# Patient Record
Sex: Female | Born: 1960 | Hispanic: Yes | Marital: Married | Smoking: Never smoker
Health system: Southern US, Community
[De-identification: ages and names within clinical notes are randomized; demographics above are authoritative.]

## PROBLEM LIST (undated history)

## (undated) DIAGNOSIS — E785 Hyperlipidemia, unspecified: Secondary | ICD-10-CM

## (undated) DIAGNOSIS — N183 Chronic kidney disease, stage 3 unspecified: Secondary | ICD-10-CM

## (undated) DIAGNOSIS — N281 Cyst of kidney, acquired: Secondary | ICD-10-CM

## (undated) DIAGNOSIS — M419 Scoliosis, unspecified: Secondary | ICD-10-CM

## (undated) DIAGNOSIS — R519 Headache, unspecified: Secondary | ICD-10-CM

## (undated) DIAGNOSIS — R51 Headache: Secondary | ICD-10-CM

## (undated) HISTORY — PX: APPENDECTOMY: SHX54

## (undated) HISTORY — PX: OTHER SURGICAL HISTORY: SHX169

## (undated) HISTORY — PX: TONSILLECTOMY: SUR1361

## (undated) HISTORY — PX: PTERYGIUM EXCISION: SHX2273

---

## 2011-11-27 HISTORY — PX: AUGMENTATION MAMMAPLASTY: SUR837

## 2015-04-12 ENCOUNTER — Other Ambulatory Visit: Payer: Self-pay | Admitting: Family Medicine

## 2015-04-12 DIAGNOSIS — Z1231 Encounter for screening mammogram for malignant neoplasm of breast: Secondary | ICD-10-CM

## 2015-04-20 ENCOUNTER — Ambulatory Visit: Payer: Self-pay | Attending: Family Medicine

## 2016-04-13 ENCOUNTER — Other Ambulatory Visit: Payer: Self-pay | Admitting: Family Medicine

## 2016-04-13 DIAGNOSIS — Z1231 Encounter for screening mammogram for malignant neoplasm of breast: Secondary | ICD-10-CM

## 2016-04-16 ENCOUNTER — Ambulatory Visit
Admission: RE | Admit: 2016-04-16 | Discharge: 2016-04-16 | Disposition: A | Discharge status: Home / Self Care | Payer: Federal, State, Local not specified - PPO | Source: Ambulatory Visit | Attending: Family Medicine | Admitting: Family Medicine

## 2016-04-16 ENCOUNTER — Other Ambulatory Visit: Payer: Self-pay | Admitting: Family Medicine

## 2016-04-16 DIAGNOSIS — Z1231 Encounter for screening mammogram for malignant neoplasm of breast: Secondary | ICD-10-CM | POA: Insufficient documentation

## 2016-07-23 ENCOUNTER — Encounter: Payer: Self-pay | Admitting: *Deleted

## 2016-07-24 ENCOUNTER — Encounter: Admission: RE | Payer: Self-pay | Source: Ambulatory Visit

## 2016-07-24 ENCOUNTER — Ambulatory Visit
Admission: RE | Admit: 2016-07-24 | Payer: Federal, State, Local not specified - PPO | Source: Ambulatory Visit | Admitting: Gastroenterology

## 2016-07-24 HISTORY — DX: Headache: R51

## 2016-07-24 HISTORY — DX: Headache, unspecified: R51.9

## 2016-07-24 SURGERY — COLONOSCOPY WITH PROPOFOL
Anesthesia: General

## 2016-11-02 ENCOUNTER — Encounter: Payer: Self-pay | Admitting: *Deleted

## 2016-11-05 ENCOUNTER — Encounter: Admission: RE | Payer: Self-pay | Source: Ambulatory Visit

## 2016-11-05 ENCOUNTER — Ambulatory Visit
Admission: RE | Admit: 2016-11-05 | Payer: Federal, State, Local not specified - PPO | Source: Ambulatory Visit | Admitting: Gastroenterology

## 2016-11-05 HISTORY — DX: Scoliosis, unspecified: M41.9

## 2016-11-05 SURGERY — COLONOSCOPY WITH PROPOFOL
Anesthesia: General

## 2017-05-20 ENCOUNTER — Other Ambulatory Visit: Payer: Self-pay | Admitting: Family Medicine

## 2017-05-20 DIAGNOSIS — Z1231 Encounter for screening mammogram for malignant neoplasm of breast: Secondary | ICD-10-CM

## 2017-05-23 ENCOUNTER — Ambulatory Visit
Admission: RE | Admit: 2017-05-23 | Discharge: 2017-05-23 | Disposition: A | Discharge status: Home / Self Care | Payer: Federal, State, Local not specified - PPO | Source: Ambulatory Visit | Attending: Family Medicine | Admitting: Family Medicine

## 2017-05-23 DIAGNOSIS — Z1231 Encounter for screening mammogram for malignant neoplasm of breast: Secondary | ICD-10-CM | POA: Insufficient documentation

## 2018-04-16 ENCOUNTER — Other Ambulatory Visit: Payer: Self-pay | Admitting: Family Medicine

## 2018-04-16 DIAGNOSIS — Z1231 Encounter for screening mammogram for malignant neoplasm of breast: Secondary | ICD-10-CM

## 2018-05-27 ENCOUNTER — Ambulatory Visit
Admission: RE | Admit: 2018-05-27 | Discharge: 2018-05-27 | Disposition: A | Discharge status: Home / Self Care | Payer: Federal, State, Local not specified - PPO | Source: Ambulatory Visit | Attending: Family Medicine | Admitting: Family Medicine

## 2018-05-27 ENCOUNTER — Other Ambulatory Visit: Payer: Self-pay | Admitting: Family Medicine

## 2018-05-27 DIAGNOSIS — Z1231 Encounter for screening mammogram for malignant neoplasm of breast: Secondary | ICD-10-CM

## 2018-06-12 IMAGING — MG MM DIGITAL SCREENING IMPLANTS W/ CAD
8 series · 8 of 8 positions shown · non-contrast
Comparison: Previous exam(s).

CLINICAL DATA: Screening.

EXAM:
DIGITAL SCREENING BILATERAL MAMMOGRAM WITH IMPLANTS AND CAD
The patient has retropectoral implants. Standard and implant
displaced views were performed.

[L MLO (1 of 2)]
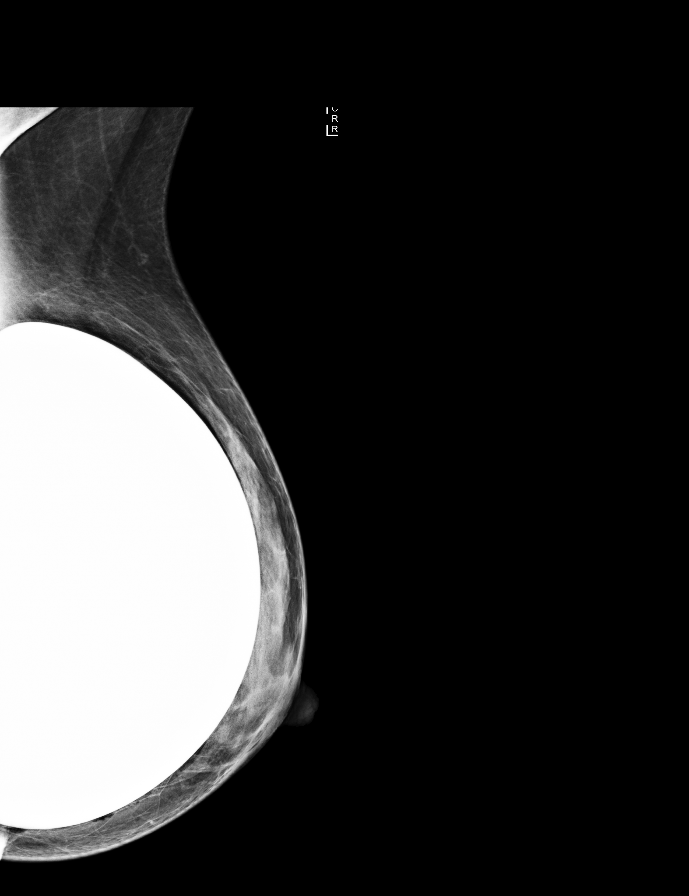

[R CC (1 of 2)]
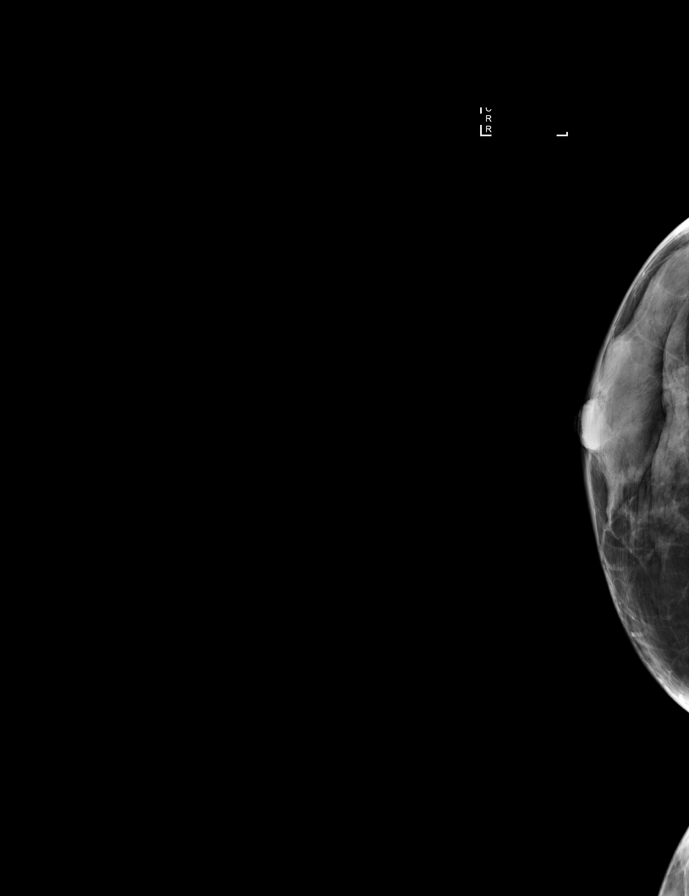

[L CC (1 of 2)]
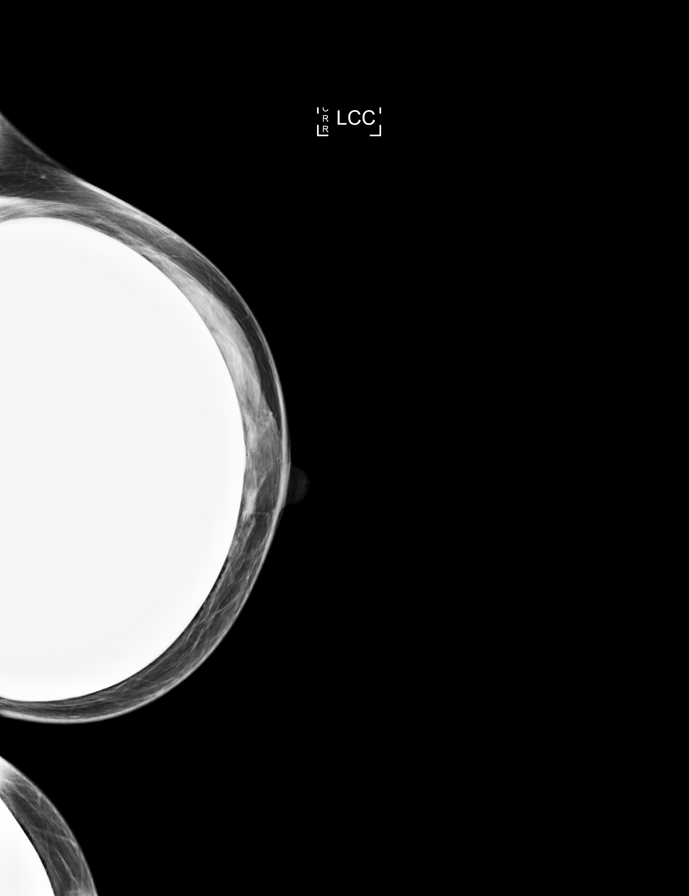

[L CC (2 of 2)]
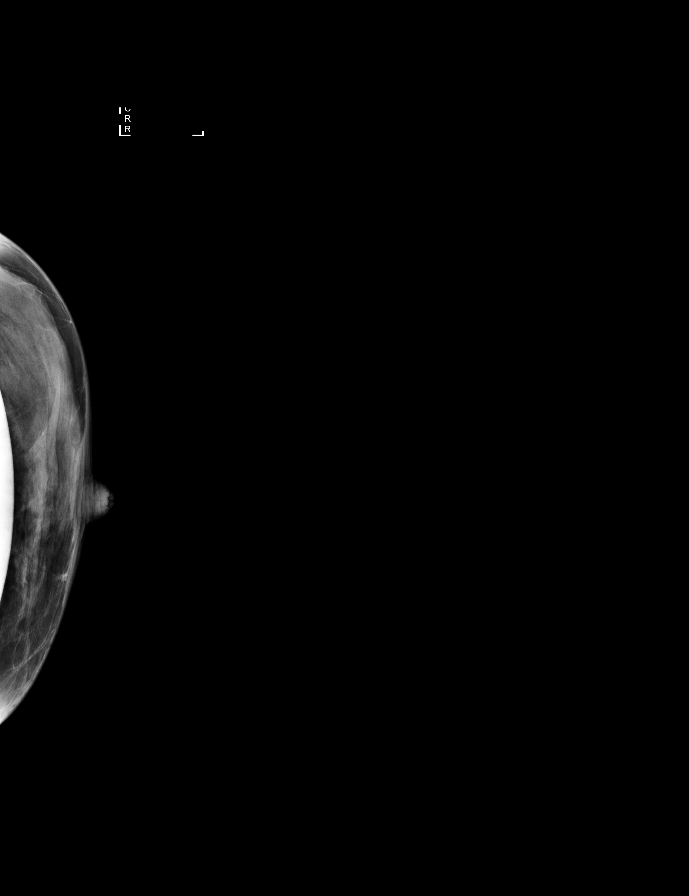

[R CC (2 of 2)]
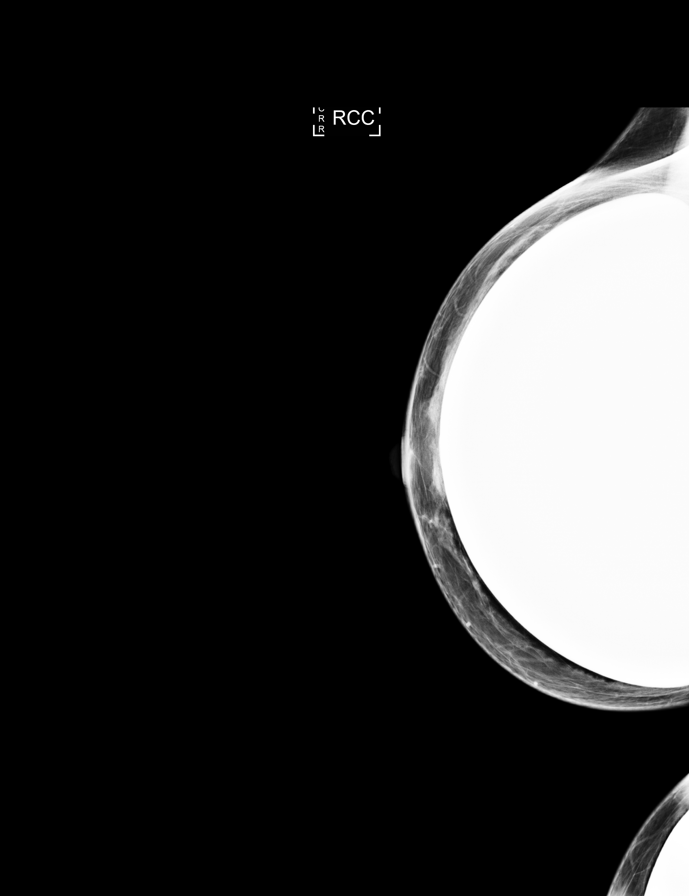

[L MLO (2 of 2)]
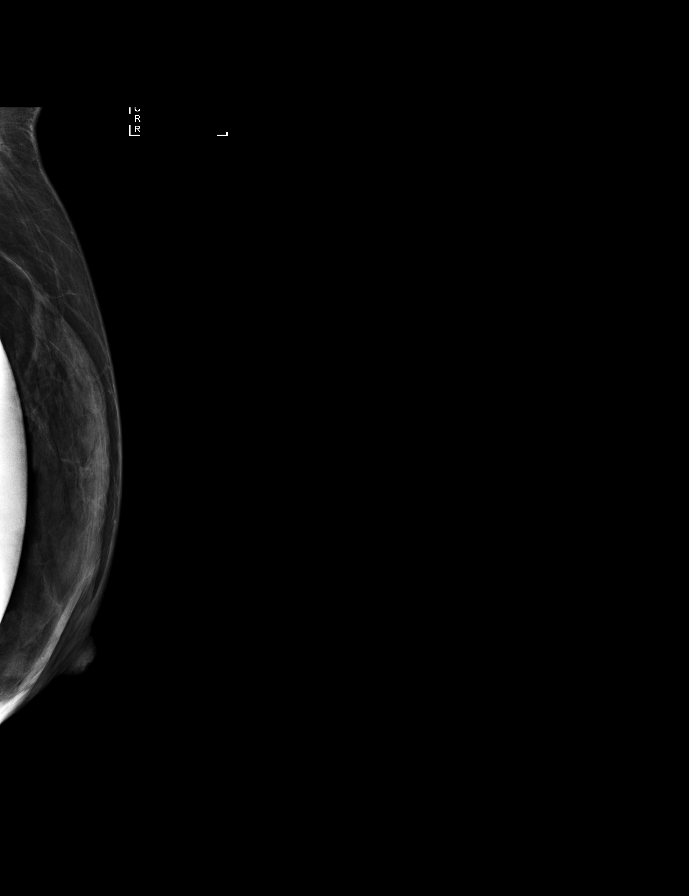

[R MLO (1 of 2)]
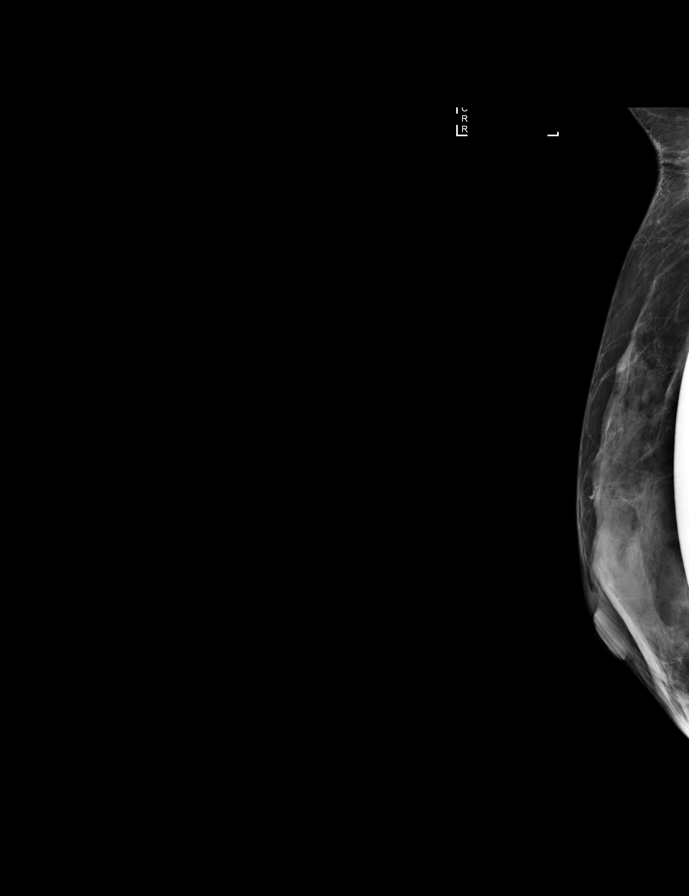

[R MLO (2 of 2)]
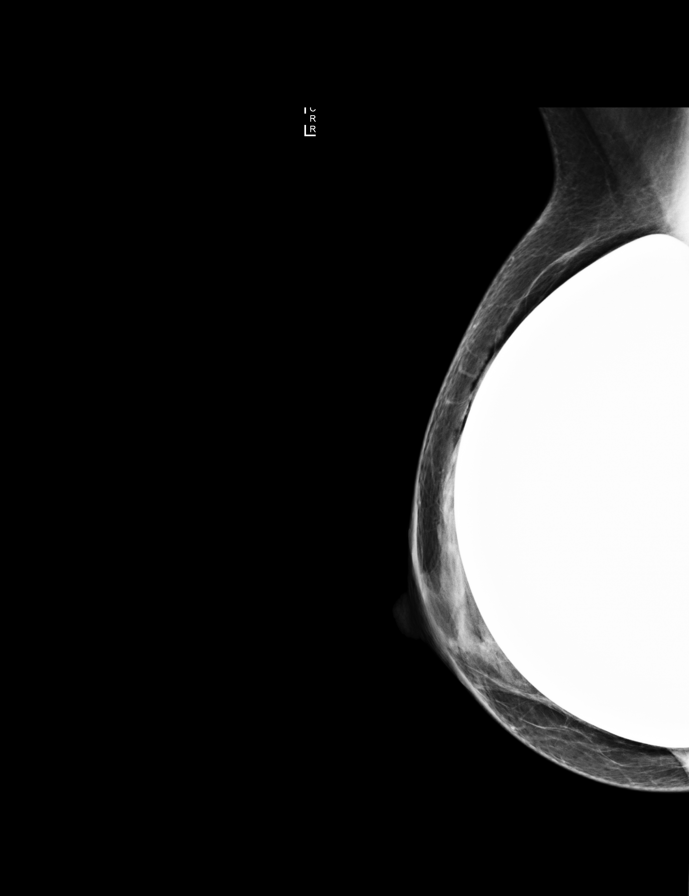

[8 of 8 positions shown; findings below may reference images not displayed]

ACR Breast Density Category d: The breast tissue is extremely dense,
which lowers the sensitivity of mammography.
FINDINGS: There are no findings suspicious for malignancy. Images were
processed with CAD.
IMPRESSION: No mammographic evidence of malignancy. A result letter of this
screening mammogram will be mailed directly to the patient.

RECOMMENDATION:
Screening mammogram in one year. (Code:[id number]-[id number])

BI-RADS CATEGORY  1:  Negative.

## 2020-05-04 ENCOUNTER — Ambulatory Visit
Admission: EM | Admit: 2020-05-04 | Discharge: 2020-05-04 | Disposition: A | Discharge status: Home / Self Care | Payer: Federal, State, Local not specified - PPO | Attending: Emergency Medicine | Admitting: Emergency Medicine

## 2020-05-04 ENCOUNTER — Encounter: Payer: Self-pay | Admitting: Emergency Medicine

## 2020-05-04 ENCOUNTER — Other Ambulatory Visit: Payer: Self-pay

## 2020-05-04 DIAGNOSIS — N183 Chronic kidney disease, stage 3 unspecified: Secondary | ICD-10-CM | POA: Insufficient documentation

## 2020-05-04 DIAGNOSIS — N1832 Chronic kidney disease, stage 3b: Secondary | ICD-10-CM

## 2020-05-04 DIAGNOSIS — M419 Scoliosis, unspecified: Secondary | ICD-10-CM | POA: Insufficient documentation

## 2020-05-04 DIAGNOSIS — E785 Hyperlipidemia, unspecified: Secondary | ICD-10-CM | POA: Insufficient documentation

## 2020-05-04 DIAGNOSIS — Z20822 Contact with and (suspected) exposure to covid-19: Secondary | ICD-10-CM | POA: Diagnosis not present

## 2020-05-04 DIAGNOSIS — Z79899 Other long term (current) drug therapy: Secondary | ICD-10-CM | POA: Diagnosis not present

## 2020-05-04 DIAGNOSIS — R197 Diarrhea, unspecified: Secondary | ICD-10-CM | POA: Insufficient documentation

## 2020-05-04 DIAGNOSIS — R519 Headache, unspecified: Secondary | ICD-10-CM

## 2020-05-04 DIAGNOSIS — H53149 Visual discomfort, unspecified: Secondary | ICD-10-CM | POA: Diagnosis not present

## 2020-05-04 HISTORY — DX: Cyst of kidney, acquired: N28.1

## 2020-05-04 HISTORY — DX: Hyperlipidemia, unspecified: E78.5

## 2020-05-04 HISTORY — DX: Chronic kidney disease, stage 3 unspecified: N18.30

## 2020-05-04 LAB — BASIC METABOLIC PANEL
Anion gap: 10 (ref 5–15)
BUN: 24 mg/dL — ABNORMAL HIGH (ref 6–20)
CO2: 24 mmol/L (ref 22–32)
Calcium: 9.2 mg/dL (ref 8.9–10.3)
Chloride: 105 mmol/L (ref 98–111)
Creatinine, Ser: 1.58 mg/dL — ABNORMAL HIGH (ref 0.44–1.00)
GFR calc Af Amer: 42 mL/min — ABNORMAL LOW (ref >60)
GFR calc non Af Amer: 36 mL/min — ABNORMAL LOW (ref >60)
Glucose, Bld: 117 mg/dL — ABNORMAL HIGH (ref 70–99)
Potassium: 5 mmol/L (ref 3.5–5.1)
Sodium: 139 mmol/L (ref 135–145)

## 2020-05-04 MED ORDER — DEXAMETHASONE SODIUM PHOSPHATE 10 MG/ML IJ SOLN
10.0000 mg | Freq: Once | INTRAMUSCULAR | Status: AC
  Administered 2020-05-04: 10 mg via INTRAMUSCULAR

## 2020-05-04 MED ORDER — ACETAMINOPHEN 500 MG PO TABS
1000.0000 mg | ORAL_TABLET | Freq: Once | ORAL | Status: AC
  Administered 2020-05-04: 1000 mg via ORAL

## 2020-05-04 MED ORDER — ONDANSETRON 4 MG PO TBDP
4.0000 mg | ORAL_TABLET | Freq: Once | ORAL | Status: AC
Start: 2020-05-04 — End: 2020-05-04
  Administered 2020-05-04: 4 mg via ORAL

## 2020-05-04 MED ORDER — ONDANSETRON 4 MG PO TBDP: 4.0000 mg | ORAL_TABLET | Freq: Three times a day (TID) | ORAL | 0 refills | Status: AC | PRN

## 2020-05-04 NOTE — ED Triage Notes (Signed)
Patient c/o dizziness, diarrhea, headache that started last night.

## 2020-05-04 NOTE — Discharge Instructions (Addendum)
May take at 1000 mg of Tylenol every 6-8 hours as needed for headache.  Do not use Excedrin.  Zofran for nausea.  It may cause constipation.  We will contact you if your Covid test comes back positive.  Follow-up with your kidney doctor.  Your creatinine today was 1.58. Follow-up with Dr. Zada Finders in 2 or 3 days if you are not getting any better.

## 2020-05-04 NOTE — ED Provider Notes (Signed)
HPI  SUBJECTIVE:  Robin Schultz is a 59 y.o. female who reports an acute onset right temporal headache described as constant, throbbing starting last night.  States it was worst at the onset but seems to be getting better. she reports nausea, photophobia.  She reports generalized weakness.  No vomiting, fevers, photophobia, ear pain, jaw pain, dental pain, nasal congestion, purulent nasal discharge, sinus pain/ pressure, rhinorrhea.  No body aches, neck stiffness, rash.  No slurred speech, arm or leg weakness, facial droop, discoordination.  No blurry vision, double vision, visual loss.  No mental status changes, seizures, syncope.  No loss of smell or taste, cough, shortness of breath.  She had 4 episodes of diarrhea today.  No abdominal pain.  No known Covid exposure.  She has gotten the 1 dose of the J&J vaccine.  She tried Excedrin without improvement in her symptoms.  There are no aggravating factors.  It is not associated with movement.  States that she had similar headaches in this area after getting the Covid vaccine.  She has a past medical history of migraines, chronic kidney disease.  Does not know what her baseline creatinine is.  No history of hypertension, diabetes, stroke, aneurysm, ICH/SAH, frequent sinusitis, temporal arteritis, atrial fibrillation.  She is not on any anticoagulants or platelets.  PMD: Dr. Zada Finders.  All history obtained through a friend acting as interpreter.  Past Medical History:  Diagnosis Date  . Chronic kidney disease (CKD), stage III (moderate)   . Headache   . Hyperlipidemia   . Renal cyst   . Scoliosis     Past Surgical History:  Procedure Laterality Date  . APPENDECTOMY    . AUGMENTATION MAMMAPLASTY Bilateral 2013  . PTERYGIUM EXCISION    . TONSILLECTOMY    . Transumblical Augmentation mammaplasty      Family History  Problem Relation Age of Onset  . Breast cancer Sister 46    Social History   Tobacco Use  . Smoking status: Never Smoker  .  Smokeless tobacco: Never Used  Substance Use Topics  . Alcohol use: No  . Drug use: No    No current facility-administered medications for this encounter.  Current Outpatient Medications:  .  atorvastatin (LIPITOR) 20 MG tablet, Take by mouth., Disp: , Rfl:  .  losartan (COZAAR) 25 MG tablet, Take by mouth., Disp: , Rfl:  .  Multiple Vitamin (MULTIVITAMIN) tablet, Take 1 tablet by mouth daily., Disp: , Rfl:  .  ondansetron (ZOFRAN ODT) 4 MG disintegrating tablet, Take 1 tablet (4 mg total) by mouth every 8 (eight) hours as needed for nausea or vomiting., Disp: 20 tablet, Rfl: 0  No Known Allergies   ROS  As noted in HPI.   Physical Exam  BP 123/81 (BP Location: Right Arm)   Pulse 83   Temp 98.5 F (36.9 C) (Oral)   Resp 18   SpO2 100%   Constitutional: Well developed, well nourished, appears uncomfortable. Eyes: PERRL, EOMI, conjunctiva normal bilaterally.  Positive photophobia HENT: Normocephalic, atraumatic,mucus membranes moist, normal dentition.  TM normal b/l. No TMJ tenderness.  No nasal congestion, no sinus tenderness. No temporal artery tenderness.  Neck: no cervical LN + bilateral trapezial muscle tenderness. No meningismus Respiratory: normal inspiratory effort, lungs clear bilaterally Cardiovascular: Normal rate, regular rhythm no murmurs rubs or gallops GI:  nondistended skin: No rash, skin intact Musculoskeletal: No edema, no tenderness, no deformities Neurologic: Alert & oriented x 3, CN III-XII intact, romberg neg, finger-> nose, heel-> shin  equal b/l, Romberg neg, tandem gait steady.  Speech fluent. Psychiatric: Speech and behavior appropriate   ED Course   Medications  acetaminophen (TYLENOL) tablet 1,000 mg (1,000 mg Oral Given 05/04/20 1607)  dexamethasone (DECADRON) injection 10 mg (10 mg Intramuscular Given 05/04/20 1609)  ondansetron (ZOFRAN-ODT) disintegrating tablet 4 mg (4 mg Oral Given 05/04/20 1607)    Orders Placed This Encounter  Procedures   . SARS CORONAVIRUS 2 (TAT 6-24 HRS) Nasopharyngeal Nasopharyngeal Swab    Standing Status:   Standing    Number of Occurrences:   1    Order Specific Question:   Is this test for diagnosis or screening    Answer:   Diagnosis of ill patient    Order Specific Question:   Symptomatic for COVID-19 as defined by CDC    Answer:   Yes    Order Specific Question:   Date of Symptom Onset    Answer:   05/03/2020    Order Specific Question:   Hospitalized for COVID-19    Answer:   Yes    Order Specific Question:   Admitted to ICU for COVID-19    Answer:   No    Order Specific Question:   Previously tested for COVID-19    Answer:   No    Order Specific Question:   Resident in a congregate (group) care setting    Answer:   No    Order Specific Question:   Employed in healthcare setting    Answer:   No    Order Specific Question:   Pregnant    Answer:   No    Order Specific Question:   Has patient completed COVID vaccination(s) (2 doses of Pfizer/Moderna 1 dose of [employer])    Answer:   Yes  . Basic metabolic panel    Standing Status:   Standing    Number of Occurrences:   1   Results for orders placed or performed during the hospital encounter of 05/04/20 (from the past 24 hour(s))  Basic metabolic panel     Status: Abnormal   Collection Time: 05/04/20  3:14 PM  Result Value Ref Range   Sodium 139 135 - 145 mmol/L   Potassium 5.0 3.5 - 5.1 mmol/L   Chloride 105 98 - 111 mmol/L   CO2 24 22 - 32 mmol/L   Glucose, Bld 117 (H) 70 - 99 mg/dL   BUN 24 (H) 6 - 20 mg/dL   Creatinine, Ser 6.26 (H) 0.44 - 1.00 mg/dL   Calcium 9.2 8.9 - 94.8 mg/dL   GFR calc non Af Amer 36 (L) >60 mL/min   GFR calc Af Amer 42 (L) >60 mL/min   Anion gap 10 5 - 15   No results found.   ED Clinical Impression  1. Bad headache   2. Stage 3b chronic kidney disease     ED Assessment/Plan  She has several concerning historical features of her headache: She states that it was a sudden onset headache,  worst at the beginning. However she also states that she had identical headaches after getting the J&J vaccine. She does not have any focal neurologic deficits and the headache started last night.  We do not have CT at this facility today.  Discussed with patient that I could transfer her to the emergency department for further evaluation and imaging or we could try treating this aggressively as a migraine headache after getting kidney function with the understanding that if the headache changes  or gets worse she is to go immediately to the emergency department.  Patient has opted to try treating this as if this is a migraine.  Pt without fevers/chills, Pt has no meningeal sx, no nuchal rigidity. Doubt meningitis. Pt with normal neuro exam, no evidence of CVA/TIA.  Pt BP not elevated significantly, doubt hypertensive emergency. No evidence of temporal artery tenderness, no evidence of glaucoma or other ocular pathology.  Care everywhere records and labs reviewed.  Last creatinine 10/20/2019 was 1.3. Today's BUN 24 creatinine 1.58.  Calculated creatinine clearance 34.96 mL/min.  will give 1000 g of Tylenol 4 mg of Zofran and 10 mg of dexamethasone IM.  No NSAIDs due to creatinine clearance.   Pt much improved after medications. Pt with continued non-focal neuro exam.   Patient with a bad headache and worsening of her kidney function.  Will d/c home with Tylenol 1000 mg every 6-8 hours, Zofran 4 mg 3 times daily  and have pt F/U with PCP if not getting better.  Discussed with her to to limit the use of Excedrin.  She has her husband at home who can keep an eye on her.  Gave her strict ER return precautions.  Using a translator, discussed labs, MDM, plan for follow up, signs and sx that should prompt return to ER.  Also discussed findings, labs, treatment plan and plan for follow-up with husband.  They agree with plan  Meds ordered this encounter  Medications  . acetaminophen (TYLENOL) tablet 1,000 mg  .  dexamethasone (DECADRON) injection 10 mg  . ondansetron (ZOFRAN-ODT) disintegrating tablet 4 mg  . ondansetron (ZOFRAN ODT) 4 MG disintegrating tablet    Sig: Take 1 tablet (4 mg total) by mouth every 8 (eight) hours as needed for nausea or vomiting.    Dispense:  20 tablet    Refill:  0    *This clinic note was created using [employer]. Therefore, there may be occasional mistakes despite careful proofreading.  ?    Melynda Ripple, MD 05/04/20 [phone]

## 2020-05-05 ENCOUNTER — Telehealth: Payer: Self-pay | Admitting: Emergency Medicine

## 2020-05-05 LAB — SARS CORONAVIRUS 2 (TAT 6-24 HRS): SARS Coronavirus 2: NEGATIVE

## 2020-05-05 NOTE — Telephone Encounter (Signed)
Telephone note created in error AM

## 2020-09-12 ENCOUNTER — Other Ambulatory Visit: Payer: Self-pay | Admitting: Family Medicine

## 2020-09-12 DIAGNOSIS — Z1231 Encounter for screening mammogram for malignant neoplasm of breast: Secondary | ICD-10-CM

## 2020-09-28 ENCOUNTER — Other Ambulatory Visit: Payer: Self-pay

## 2020-09-28 ENCOUNTER — Ambulatory Visit
Admission: RE | Admit: 2020-09-28 | Discharge: 2020-09-28 | Disposition: A | Discharge status: Home / Self Care | Payer: Federal, State, Local not specified - PPO | Source: Ambulatory Visit | Attending: Family Medicine | Admitting: Family Medicine

## 2020-09-28 DIAGNOSIS — Z1231 Encounter for screening mammogram for malignant neoplasm of breast: Secondary | ICD-10-CM | POA: Diagnosis not present

## 2021-04-27 ENCOUNTER — Encounter: Payer: Self-pay | Admitting: *Deleted

## 2021-04-28 ENCOUNTER — Encounter: Admission: RE | Payer: Self-pay | Source: Home / Self Care

## 2021-04-28 ENCOUNTER — Ambulatory Visit: Admission: RE | Admit: 2021-04-28 | Payer: Federal, State, Local not specified - PPO | Source: Home / Self Care

## 2021-04-28 SURGERY — COLONOSCOPY WITH PROPOFOL
Anesthesia: General

## 2022-03-15 ENCOUNTER — Other Ambulatory Visit: Payer: Self-pay | Admitting: Family Medicine

## 2022-03-15 DIAGNOSIS — Z1231 Encounter for screening mammogram for malignant neoplasm of breast: Secondary | ICD-10-CM

## 2022-04-25 ENCOUNTER — Ambulatory Visit
Admission: RE | Admit: 2022-04-25 | Discharge: 2022-04-25 | Disposition: A | Discharge status: Home / Self Care | Payer: Federal, State, Local not specified - PPO | Source: Ambulatory Visit | Attending: Family Medicine | Admitting: Family Medicine

## 2022-04-25 DIAGNOSIS — Z1231 Encounter for screening mammogram for malignant neoplasm of breast: Secondary | ICD-10-CM | POA: Insufficient documentation

## 2023-02-26 ENCOUNTER — Other Ambulatory Visit: Payer: Self-pay

## 2023-02-26 ENCOUNTER — Ambulatory Visit: Admission: EM | Admit: 2023-02-26 | Discharge: 2023-02-26

## 2023-02-26 ENCOUNTER — Emergency Department
Admission: EM | Admit: 2023-02-26 | Discharge: 2023-02-27 | Disposition: A | Discharge status: Home / Self Care | Attending: Emergency Medicine | Admitting: Emergency Medicine

## 2023-02-26 ENCOUNTER — Encounter: Payer: Self-pay | Admitting: Emergency Medicine

## 2023-02-26 ENCOUNTER — Emergency Department

## 2023-02-26 DIAGNOSIS — R443 Hallucinations, unspecified: Secondary | ICD-10-CM

## 2023-02-26 DIAGNOSIS — F419 Anxiety disorder, unspecified: Secondary | ICD-10-CM | POA: Diagnosis not present

## 2023-02-26 DIAGNOSIS — R519 Headache, unspecified: Secondary | ICD-10-CM

## 2023-02-26 DIAGNOSIS — N183 Chronic kidney disease, stage 3 unspecified: Secondary | ICD-10-CM | POA: Diagnosis not present

## 2023-02-26 DIAGNOSIS — F333 Major depressive disorder, recurrent, severe with psychotic symptoms: Secondary | ICD-10-CM | POA: Diagnosis not present

## 2023-02-26 DIAGNOSIS — F32A Depression, unspecified: Secondary | ICD-10-CM

## 2023-02-26 DIAGNOSIS — R440 Auditory hallucinations: Secondary | ICD-10-CM | POA: Diagnosis present

## 2023-02-26 DIAGNOSIS — F29 Unspecified psychosis not due to a substance or known physiological condition: Secondary | ICD-10-CM

## 2023-02-26 LAB — CBC WITH DIFFERENTIAL/PLATELET
Abs Immature Granulocytes: 0.02 10*3/uL (ref 0.00–0.07)
Basophils Absolute: 0 10*3/uL (ref 0.0–0.1)
Basophils Relative: 1 %
Eosinophils Absolute: 0 10*3/uL (ref 0.0–0.5)
Eosinophils Relative: 1 %
HCT: 33.9 % — ABNORMAL LOW (ref 36.0–46.0)
Hemoglobin: 10.7 g/dL — ABNORMAL LOW (ref 12.0–15.0)
Immature Granulocytes: 0 %
Lymphocytes Relative: 28 %
Lymphs Abs: 1.8 10*3/uL (ref 0.7–4.0)
MCH: 30 pg (ref 26.0–34.0)
MCHC: 31.6 g/dL (ref 30.0–36.0)
MCV: 95 fL (ref 80.0–100.0)
Monocytes Absolute: 0.5 10*3/uL (ref 0.1–1.0)
Monocytes Relative: 7 %
Neutro Abs: 4.1 10*3/uL (ref 1.7–7.7)
Neutrophils Relative %: 63 %
Platelets: 246 10*3/uL (ref 150–400)
RBC: 3.57 MIL/uL — ABNORMAL LOW (ref 3.87–5.11)
RDW: 14.1 % (ref 11.5–15.5)
WBC: 6.5 10*3/uL (ref 4.0–10.5)
nRBC: 0 % (ref 0.0–0.2)

## 2023-02-26 LAB — URINALYSIS, ROUTINE W REFLEX MICROSCOPIC
Bilirubin Urine: NEGATIVE
Glucose, UA: NEGATIVE mg/dL
Ketones, ur: NEGATIVE mg/dL
Leukocytes,Ua: NEGATIVE
Nitrite: NEGATIVE
Protein, ur: 100 mg/dL — AB
Specific Gravity, Urine: 1.01 (ref 1.005–1.030)
pH: 6 (ref 5.0–8.0)

## 2023-02-26 LAB — COMPREHENSIVE METABOLIC PANEL
ALT: 15 U/L (ref 0–44)
AST: 29 U/L (ref 15–41)
Albumin: 4.3 g/dL (ref 3.5–5.0)
Alkaline Phosphatase: 81 U/L (ref 38–126)
Anion gap: 11 (ref 5–15)
BUN: 23 mg/dL — ABNORMAL HIGH (ref 6–20)
CO2: 23 mmol/L (ref 22–32)
Calcium: 9.1 mg/dL (ref 8.9–10.3)
Chloride: 105 mmol/L (ref 98–111)
Creatinine, Ser: 1.54 mg/dL — ABNORMAL HIGH (ref 0.44–1.00)
GFR, Estimated: 39 mL/min — ABNORMAL LOW (ref >60)
Glucose, Bld: 113 mg/dL — ABNORMAL HIGH (ref 70–99)
Potassium: 4 mmol/L (ref 3.5–5.1)
Sodium: 139 mmol/L (ref 135–145)
Total Bilirubin: 0.9 mg/dL (ref 0.3–1.2)
Total Protein: 7.3 g/dL (ref 6.5–8.1)

## 2023-02-26 LAB — SALICYLATE LEVEL: Salicylate Lvl: <7 mg/dL — ABNORMAL LOW (ref 7.0–30.0)

## 2023-02-26 LAB — URINE DRUG SCREEN, QUALITATIVE (ARMC ONLY)
Amphetamines, Ur Screen: NOT DETECTED
Barbiturates, Ur Screen: NOT DETECTED
Benzodiazepine, Ur Scrn: NOT DETECTED
Cannabinoid 50 Ng, Ur ~~LOC~~: NOT DETECTED
Cocaine Metabolite,Ur ~~LOC~~: NOT DETECTED
MDMA (Ecstasy)Ur Screen: NOT DETECTED
Methadone Scn, Ur: NOT DETECTED
Opiate, Ur Screen: NOT DETECTED
Phencyclidine (PCP) Ur S: NOT DETECTED
Tricyclic, Ur Screen: NOT DETECTED

## 2023-02-26 LAB — POC URINE PREG, ED: Preg Test, Ur: NEGATIVE

## 2023-02-26 LAB — CBC
HCT: 34.1 % — ABNORMAL LOW (ref 36.0–46.0)
Hemoglobin: 10.6 g/dL — ABNORMAL LOW (ref 12.0–15.0)
MCH: 29.2 pg (ref 26.0–34.0)
MCHC: 31.1 g/dL (ref 30.0–36.0)
MCV: 93.9 fL (ref 80.0–100.0)
Platelets: 248 10*3/uL (ref 150–400)
RBC: 3.63 MIL/uL — ABNORMAL LOW (ref 3.87–5.11)
RDW: 14.1 % (ref 11.5–15.5)
WBC: 6.4 10*3/uL (ref 4.0–10.5)
nRBC: 0 % (ref 0.0–0.2)

## 2023-02-26 LAB — ETHANOL: Alcohol, Ethyl (B): <10 mg/dL (ref <10)

## 2023-02-26 LAB — ACETAMINOPHEN LEVEL: Acetaminophen (Tylenol), Serum: <10 ug/mL — ABNORMAL LOW (ref 10–30)

## 2023-02-26 LAB — T4, FREE: Free T4: 0.88 ng/dL (ref 0.61–1.12)

## 2023-02-26 LAB — TSH: TSH: 2.338 u[IU]/mL (ref 0.350–4.500)

## 2023-02-26 MED ORDER — DIPHENHYDRAMINE HCL 25 MG PO CAPS: 50.0000 mg | ORAL_CAPSULE | Freq: Three times a day (TID) | ORAL | Status: DC | PRN

## 2023-02-26 MED ORDER — ACETAMINOPHEN 325 MG PO TABS
650.0000 mg | ORAL_TABLET | Freq: Four times a day (QID) | ORAL | Status: DC | PRN
  Administered 2023-02-27: 650 mg via ORAL
  Filled 2023-02-26 (×2): qty 2

## 2023-02-26 MED ORDER — TRAZODONE HCL 50 MG PO TABS
50.0000 mg | ORAL_TABLET | Freq: Every day | ORAL | Status: DC
  Administered 2023-02-26: 50 mg via ORAL
  Filled 2023-02-26: qty 1

## 2023-02-26 MED ORDER — HALOPERIDOL LACTATE 5 MG/ML IJ SOLN: 5.0000 mg | Freq: Three times a day (TID) | INTRAMUSCULAR | Status: DC | PRN

## 2023-02-26 MED ORDER — DIPHENHYDRAMINE HCL 50 MG/ML IJ SOLN: 50.0000 mg | Freq: Three times a day (TID) | INTRAMUSCULAR | Status: DC | PRN

## 2023-02-26 MED ORDER — HYDROXYZINE HCL 25 MG PO TABS: 25.0000 mg | ORAL_TABLET | Freq: Three times a day (TID) | ORAL | Status: DC | PRN

## 2023-02-26 MED ORDER — LORAZEPAM 2 MG/ML IJ SOLN: 2.0000 mg | Freq: Three times a day (TID) | INTRAMUSCULAR | Status: DC | PRN

## 2023-02-26 MED ORDER — LORAZEPAM 2 MG PO TABS: 2.0000 mg | ORAL_TABLET | Freq: Three times a day (TID) | ORAL | Status: DC | PRN

## 2023-02-26 MED ORDER — HALOPERIDOL 5 MG PO TABS: 5.0000 mg | ORAL_TABLET | Freq: Three times a day (TID) | ORAL | Status: DC | PRN

## 2023-02-26 NOTE — ED Notes (Signed)
Pt declines STD testing at this time. Pt reports she has not been sexually active in a very long time. NP that ordered test notified

## 2023-02-26 NOTE — Consult Note (Addendum)
[facility] Face-to-Face Psychiatry Consult   Reason for Consult:  hallucinations Referring Physician:  Blake Divine, MD Patient Identification: Robin Schultz MRN:  [id number] Principal Diagnosis: Severe recurrent major depressive disorder with psychotic features Diagnosis:  Principal Problem:   Severe recurrent major depressive disorder with psychotic features   Total Time spent with patient: 45 minutes  Subjective:   Robin Schultz is a 62 y.o. female patient with past psychiatric history of major depression disorder who presented to [facility] via husband for increased depression symptoms, insomnia, and recent onset of hallucinations.   Patient observed laying in bed with covers over her face. Interpreter used for this assessment [facility] [phone]). Patient partially alert; oriented to person, place 'hospital', and situation. Reports reason for admission as 'I didn't feel well. Couldn't walk. Felt like my life was leaving me'. She initially appeared possibly paranoid wondering if tele-interpreter was recording conversation asking for a copy once assessment was complete; provider took time to reassure patient that interpreter was the same as if she were in person and no longer mentioned being recorded.   She reports no sleep x 2 nights; 'it's agitated me because I can't sleep'. She became tearful intermittently throughout assessment saying she is originally from [place] and her mother is sick. Says she usually visits annually but will not be able to take the trip this year due to finances and feels this is when her mother 'needs' her most. Says as a result she is worrying more and crying feeling guilty. She reports poor sleep, anhedonia, some feelings of guilt, denies any feelings of worthlessness, 'pretty bad' concentration with increased forgetfulness, normal appetite, some psychomotor agitation. She denies any suicidal or homicidal ideations. Says her gynecologist recently prescribed a hormonal cream for her  vaginal dryness that caused intense hot flashes and overall discomfort; feels made her feel worse mentally. Mentions her doctor doctor prescribed medications for depression in December that she took 'when feeling depressed' and during follow up in February was told the proper way to take medication (daily) in which she describes has since made her feel worse. Stopped taking medications a few days ago due to increased symptoms. States last night she attempted to sleep when she woke up to use the bathroom and went to make sure the television was turned off when she noticed it was turned off she could hear 'voices' as if it were still on which frightened her. Says she put her ear to the television to see if the voices were coming from the television; she denies receiving any messages or commands and describes the voices as 'I couldn't understand, just talking'. She reports feeling safe at home and the hospital. Identifies husband as active support. Discussed concerns with onset of voices and recommendation for overnight observation with some medications to treat insomnia; patient amenable to treatment for insomnia only at this time. She has requested not to restart Celexa or any other medications unless 'necessary'.     Collateral: Nusaiba Brilliant (husband) at bedside Denies any history of psychiatric issues. Endorses increased worry regarding mother, says had surgery (hysterectomy) in [place]. Reports patient has not slept a full nights sleep in days but became worried last night when he was awakened in the middle of the night to her putting her ear to the television due to voices. Denies any safety concerns otherwise.   Past Psychiatric History: major depressive disorder  Risk to Self: pt denies Risk to Others: pt denies Prior Inpatient Therapy: pt denies Prior Outpatient Therapy: pt  denies  Past Medical History:  Past Medical History:  Diagnosis Date   Chronic kidney disease (CKD), stage III (moderate)     Headache    Hyperlipidemia    Renal cyst    Scoliosis     Past Surgical History:  Procedure Laterality Date   APPENDECTOMY     AUGMENTATION MAMMAPLASTY Bilateral 2013   PTERYGIUM EXCISION     TONSILLECTOMY     Transumblical Augmentation mammaplasty     Family History:  Family History  Problem Relation Age of Onset   Breast cancer Sister 62   Hypertension Mother    Family Psychiatric History: denies any Social History:  Social History   Substance and Sexual Activity  Alcohol Use No     Social History   Substance and Sexual Activity  Drug Use No    Social History   Socioeconomic History   Marital status: Married    Spouse name: Not on file   Number of children: Not on file   Years of education: Not on file   Highest education level: Not on file  Occupational History   Not on file  Tobacco Use   Smoking status: Never   Smokeless tobacco: Never  Vaping Use   Vaping Use: Never used  Substance and Sexual Activity   Alcohol use: No   Drug use: No   Sexual activity: Not on file  Other Topics Concern   Not on file  Social History Narrative   Not on file   Social Determinants of Health   Financial Resource Strain: Not on file  Food Insecurity: Not on file  Transportation Needs: Not on file  Physical Activity: Not on file  Stress: Not on file  Social Connections: Not on file   Additional Social History:    Allergies:  No Known Allergies  Labs:  Results for orders placed or performed during the hospital encounter of 02/26/23 (from the past 48 hour(s))  Comprehensive metabolic panel     Status: Abnormal   Collection Time: 02/26/23  9:20 AM  Result Value Ref Range   Sodium 139 135 - 145 mmol/L   Potassium 4.0 3.5 - 5.1 mmol/L   Chloride 105 98 - 111 mmol/L   CO2 23 22 - 32 mmol/L   Glucose, Bld 113 (H) 70 - 99 mg/dL    Comment: Glucose reference range applies only to samples taken after fasting for at least 8 hours.   BUN 23 (H) 6 - 20 mg/dL    Creatinine, Ser 1.54 (H) 0.44 - 1.00 mg/dL   Calcium 9.1 8.9 - 10.3 mg/dL   Total Protein 7.3 6.5 - 8.1 g/dL   Albumin 4.3 3.5 - 5.0 g/dL   AST 29 15 - 41 U/L   ALT 15 0 - 44 U/L   Alkaline Phosphatase 81 38 - 126 U/L   Total Bilirubin 0.9 0.3 - 1.2 mg/dL   GFR, Estimated 39 (L) >60 mL/min    Comment: (NOTE) Calculated using the CKD-EPI Creatinine Equation (2021)    Anion gap 11 5 - 15    Comment: Performed at [facility], [address] [place], [place] [id number]  Salicylate level     Status: Abnormal   Collection Time: 02/26/23  9:20 AM  Result Value Ref Range   Salicylate Lvl Q000111Q (L) 7.0 - 30.0 mg/dL    Comment: Performed at [facility], [address] [place], [place] [address]  Acetaminophen level     Status: Abnormal  Collection Time: 02/26/23  9:20 AM  Result Value Ref Range   Acetaminophen (Tylenol), Serum <10 (L) 10 - 30 ug/mL    Comment: (NOTE) Therapeutic concentrations vary significantly. A range of 10-30 ug/mL  may be an effective concentration for many patients. However, some  are best treated at concentrations outside of this range. Acetaminophen concentrations >150 ug/mL at 4 hours after ingestion  and >50 ug/mL at 12 hours after ingestion are often associated with  toxic reactions.  Performed at [facility], [address] [place], [place] [address]   cbc     Status: Abnormal   Collection Time: 02/26/23  9:20 AM  Result Value Ref Range   WBC 6.4 4.0 - 10.5 K/uL   RBC 3.63 (L) 3.87 - 5.11 MIL/uL   Hemoglobin 10.6 (L) 12.0 - 15.0 g/dL   HCT 34.1 (L) 36.0 - 46.0 %   MCV 93.9 80.0 - 100.0 fL   MCH 29.2 26.0 - 34.0 pg   MCHC 31.1 30.0 - 36.0 g/dL   RDW 14.1 11.5 - 15.5 %   Platelets 248 150 - 400 K/uL   nRBC 0.0 0.0 - 0.2 %    Comment: Performed at [facility], [address] [place], [place] [address]  TSH     Status: None   Collection Time: 02/26/23  9:20 AM  Result Value Ref Range   TSH 2.338 0.350 -  4.500 uIU/mL    Comment: Performed by a 3rd Generation assay with a functional sensitivity of <=0.01 uIU/mL. Performed at [facility], [address] [place], [place] [address]   T4, free     Status: None   Collection Time: 02/26/23  9:20 AM  Result Value Ref Range   Free T4 0.88 0.61 - 1.12 ng/dL    Comment: (NOTE) Biotin ingestion may interfere with free T4 tests. If the results are inconsistent with the TSH level, previous test results, or the clinical presentation, then consider biotin interference. If needed, order repeat testing after stopping biotin. Performed at [facility], [address] [place], [place] [address]   Ethanol     Status: None   Collection Time: 02/26/23  9:24 AM  Result Value Ref Range   Alcohol, Ethyl (B) <10 <10 mg/dL    Comment: (NOTE) Lowest detectable limit for serum alcohol is 10 mg/dL.  For medical purposes only. Performed at [facility], [address] [place], [place] [address]   Urine Drug Screen, Qualitative     Status: None   Collection Time: 02/26/23  9:24 AM  Result Value Ref Range   Tricyclic, Ur Screen NONE DETECTED NONE DETECTED   Amphetamines, Ur Screen NONE DETECTED NONE DETECTED   MDMA (Ecstasy)Ur Screen NONE DETECTED NONE DETECTED   Cocaine Metabolite,Ur [place] NONE DETECTED NONE DETECTED   Opiate, Ur Screen NONE DETECTED NONE DETECTED   Phencyclidine (PCP) Ur S NONE DETECTED NONE DETECTED   Cannabinoid 50 Ng, Ur  NONE DETECTED NONE DETECTED   Barbiturates, Ur Screen NONE DETECTED NONE DETECTED   Benzodiazepine, Ur Scrn NONE DETECTED NONE DETECTED   Methadone Scn, Ur NONE DETECTED NONE DETECTED    Comment: (NOTE) Tricyclics + metabolites, urine    Cutoff 1000 ng/mL Amphetamines + metabolites, urine  Cutoff 1000 ng/mL MDMA (Ecstasy), urine              Cutoff 500 ng/mL Cocaine Metabolite, urine          Cutoff 300 ng/mL Opiate + metabolites, urine  Cutoff 300 ng/mL Phencyclidine (PCP),  urine         Cutoff 25 ng/mL Cannabinoid, urine                 Cutoff 50 ng/mL Barbiturates + metabolites, urine  Cutoff 200 ng/mL Benzodiazepine, urine              Cutoff 200 ng/mL Methadone, urine                   Cutoff 300 ng/mL  The urine drug screen provides only a preliminary, unconfirmed analytical test result and should not be used for non-medical purposes. Clinical consideration and professional judgment should be applied to any positive drug screen result due to possible interfering substances. A more specific alternate chemical method must be used in order to obtain a confirmed analytical result. Gas chromatography / mass spectrometry (GC/MS) is the preferred confirm atory method. Performed at [facility], [address] [place], [place] [address]   Urinalysis, Routine w reflex microscopic -Urine, Clean Catch     Status: Abnormal   Collection Time: 02/26/23  9:24 AM  Result Value Ref Range   Color, Urine YELLOW (A) YELLOW   APPearance CLEAR (A) CLEAR   Specific Gravity, Urine 1.010 1.005 - 1.030   pH 6.0 5.0 - 8.0   Glucose, UA NEGATIVE NEGATIVE mg/dL   Hgb urine dipstick LARGE (A) NEGATIVE   Bilirubin Urine NEGATIVE NEGATIVE   Ketones, ur NEGATIVE NEGATIVE mg/dL   Protein, ur 100 (A) NEGATIVE mg/dL   Nitrite NEGATIVE NEGATIVE   Leukocytes,Ua NEGATIVE NEGATIVE   RBC / HPF 21-50 0 - 5 RBC/hpf   WBC, UA 0-5 0 - 5 WBC/hpf   Bacteria, UA RARE (A) NONE SEEN   Squamous Epithelial / HPF 0-5 0 - 5 /HPF    Comment: Performed at [facility], [address] [place],  [address]  CBC with Differential/Platelet     Status: Abnormal   Collection Time: 02/26/23  9:24 AM  Result Value Ref Range   WBC 6.5 4.0 - 10.5 K/uL   RBC 3.57 (L) 3.87 - 5.11 MIL/uL   Hemoglobin 10.7 (L) 12.0 - 15.0 g/dL   HCT 33.9 (L) 36.0 - 46.0 %   MCV 95.0 80.0 - 100.0 fL   MCH 30.0 26.0 - 34.0 pg   MCHC 31.6 30.0 - 36.0 g/dL   RDW 14.1 11.5 - 15.5 %   Platelets 246  150 - 400 K/uL   nRBC 0.0 0.0 - 0.2 %   Neutrophils Relative % 63 %   Neutro Abs 4.1 1.7 - 7.7 K/uL   Lymphocytes Relative 28 %   Lymphs Abs 1.8 0.7 - 4.0 K/uL   Monocytes Relative 7 %   Monocytes Absolute 0.5 0.1 - 1.0 K/uL   Eosinophils Relative 1 %   Eosinophils Absolute 0.0 0.0 - 0.5 K/uL   Basophils Relative 1 %   Basophils Absolute 0.0 0.0 - 0.1 K/uL   Immature Granulocytes 0 %   Abs Immature Granulocytes 0.02 0.00 - 0.07 K/uL    Comment: Performed at [facility], [address] [place],  [address]  POC urine preg, ED     Status: None   Collection Time: 02/26/23  9:32 AM  Result Value Ref Range   Preg Test, Ur NEGATIVE NEGATIVE    Comment:        THE SENSITIVITY OF THIS METHODOLOGY IS >24 mIU/mL     Current Facility-Administered Medications  Medication Dose Route Frequency Provider Last Rate Last Admin   acetaminophen (TYLENOL) tablet 650 mg  650 mg Oral Q6H PRN Duffy Bruce, MD       haloperidol (HALDOL) tablet 5 mg  5 mg Oral TID PRN Leevy-Johnson, Marshawn Normoyle A, NP       Or   haloperidol lactate (HALDOL) injection 5 mg  5 mg Intramuscular TID PRN Leevy-Johnson, Nicholes Hibler A, NP       Or   diphenhydrAMINE (BENADRYL) capsule 50 mg  50 mg Oral TID PRN Leevy-Johnson, Pavel Gadd A, NP       Or   diphenhydrAMINE (BENADRYL) injection 50 mg  50 mg Intravenous TID PRN Leevy-Johnson, Marx Doig A, NP       hydrOXYzine (ATARAX) tablet 25 mg  25 mg Oral TID PRN Leevy-Johnson, Ladaja Yusupov A, NP       LORazepam (ATIVAN) tablet 2 mg  2 mg Oral TID PRN Leevy-Johnson, Avarey Yaeger A, NP       Or   LORazepam (ATIVAN) injection 2 mg  2 mg Intramuscular TID PRN Leevy-Johnson, Reshma Hoey A, NP       traZODone (DESYREL) tablet 50 mg  50 mg Oral QHS Leevy-Johnson, Kaithlyn Teagle A, NP   50 mg at 02/26/23 2134   Current Outpatient Medications  Medication Sig Dispense Refill   Aspirin-Acetaminophen-Caffeine (EXCEDRIN PO) Take by mouth as needed.     atorvastatin (LIPITOR) 20 MG tablet Take by mouth.      Calcium Carbonate-Vit D-Min (CALCIUM 1200 PO) Take by mouth daily.     citalopram (CELEXA) 20 MG tablet Take by mouth.     losartan (COZAAR) 25 MG tablet Take by mouth.     Multiple Vitamin (MULTIVITAMIN) tablet Take 1 tablet by mouth daily.     naproxen (NAPROSYN) 500 MG tablet Take 500 mg by mouth 2 (two) times daily with a meal.     olmesartan (BENICAR) 40 MG tablet Take 40 mg by mouth daily.     ondansetron (ZOFRAN ODT) 4 MG disintegrating tablet Take 1 tablet (4 mg total) by mouth every 8 (eight) hours as needed for nausea or vomiting. 20 tablet 0    Musculoskeletal: Strength & Muscle Tone: within normal limits Gait & Station: normal Patient leans: N/A  Psychiatric Specialty Exam:  Presentation  General Appearance:  Casual  Eye Contact: Fair  Speech: Other (comment) (spanish speaking)  Speech Volume: Normal  Handedness: Right  Mood and Affect  Mood: Labile  Affect: Tearful  Thought Process  Thought Processes: Linear  Descriptions of Associations:Circumstantial  Orientation:Partial  Thought Content:Logical  History of Schizophrenia/Schizoaffective disorder:No data recorded Duration of Psychotic Symptoms:No data recorded Hallucinations:Hallucinations: Auditory Description of Auditory Hallucinations: voices  Ideas of Reference:None  Suicidal Thoughts:Suicidal Thoughts: No  Homicidal Thoughts:Homicidal Thoughts: No  Sensorium  Memory: Immediate Fair  Judgment: Fair  Insight: Lacking   Executive Functions  Concentration: Fair  Attention Span: Fair  Recall: [place] of Knowledge: Fair  Language: Fair  Psychomotor Activity  Psychomotor Activity: Psychomotor Activity: Normal  Assets  Assets: Physical Health; Resilience; Social Support  Sleep  Sleep: Sleep: Poor  Physical Exam: Physical Exam Vitals and nursing note reviewed.  Constitutional:      Appearance: She is normal weight.  HENT:     Head: Normocephalic.   Neurological:     Mental Status: She is alert.  Psychiatric:        Attention and Perception: She perceives auditory hallucinations.        Mood and Affect: Affect is tearful.  Behavior: Behavior is cooperative.        Thought Content: Thought content is paranoid. Thought content does not include homicidal or suicidal ideation. Thought content does not include homicidal or suicidal plan.        Cognition and Memory: She exhibits impaired recent memory.    Review of Systems  Psychiatric/Behavioral:  Positive for depression and hallucinations. The patient is nervous/anxious and has insomnia.    Blood pressure 115/73, pulse 71, temperature 97.9 F (36.6 C), temperature source Oral, resp. rate 18, SpO2 97 %. There is no height or weight on file to calculate BMI.  Treatment Plan Summary: Daily contact with patient to assess and evaluate symptoms and progress in treatment, Medication management, and Plan   PLAN:  Start:  - Trazodone 50 mg PO daily HS: depression, insomnia symptoms  PRNs: - Hydroxyzine 25 mg PO TID PRN anxiety - Agitation Orders: Haldol 5 mg PO/IM, Benadryl 50 mg IM/PO, Lorazepam 2 mg IM/PO TID PRN agitation for 24 hours  Disposition: Supportive therapy provided about ongoing stressors. Discussed crisis plan, support from social network, calling 911, coming to the Emergency Department, and calling Suicide Hotline.  Inda Merlin, NP 02/26/2023 11:46 PM

## 2023-02-26 NOTE — Discharge Instructions (Addendum)
-  Patient with acute psychosis with no history of neurological or mental illnesses.  For patient's safety and best care, EMS was called to transport patient to the ER for further management and evaluation.  Patient has been kept updated throughout and reassured.

## 2023-02-26 NOTE — ED Notes (Signed)
This NT pt pm snack at this time.

## 2023-02-26 NOTE — ED Triage Notes (Signed)
Pt to ED [facility] from [facility] urgent care for crying hysterically. Pt started new vaginal cream recently for vaginal dryness. Started celexa in February. Hallucinating at home that people were out to get her. Denies psych hx.

## 2023-02-26 NOTE — ED Provider Notes (Addendum)
[facility]    CSN: [id number] Arrival date & time: 02/26/23  0803      History   Chief Complaint No chief complaint on file.   HPI Robin Schultz is a 62 y.o. female.   Patient is a 62 year old female who presents with her husband chief complaint of adverse reaction to estradiol cream.  Care nurse request assistance obtaining triage information for the patient due to her frequent crying out in Spanish-speaking.  Video interpreter in use.  Patient lying on the bed crying complaining of headache and shoulder pain and being cold, mostly in the hands.  Husband states patient was seen by OB/GYN recently and given a prescription for estradiol cream to treat vaginal dryness and pain.  He states she started that medication a couple days ago.  He is stated yesterday was good they went out to the store and got some supplies at [employer].  This morning states she was in the living room when he heard her she was yelling saying that the television was talking to her and that her doctor was trying to kill her.  She had an appointment with her primary care office later today but he brought her in due to this abrupt change in her.  Per husband patient does have a history of some anxiety/depression and was started on Cymbalta back in December.  He said she reported that she does not like taking this medication so she does not take it every day but did take it yesterday.  Husband reports patient mostly speaks Spanish in the home.  He reports they have been planning on a trip to [place] later this month for their anniversary.  He states they have been together since they met in [place] and that she has been in the states for 10 years.  Reports that she does not work and has had no recent trauma and has had no recent changes at home and new bad news recently of her family he states she is in close contact with her family back home.    Past Medical History:  Diagnosis Date   Chronic kidney disease  (CKD), stage III (moderate)    Headache    Hyperlipidemia    Renal cyst    Scoliosis     There are no problems to display for this patient.   Past Surgical History:  Procedure Laterality Date   APPENDECTOMY     AUGMENTATION MAMMAPLASTY Bilateral 2013   PTERYGIUM EXCISION     TONSILLECTOMY     Transumblical Augmentation mammaplasty      OB History   No obstetric history on file.      Home Medications    Prior to Admission medications   Medication Sig Start Date End Date Taking? Authorizing Provider  citalopram (CELEXA) 20 MG tablet Take by mouth. 01/09/23 01/09/24 Yes [provider]  Aspirin-Acetaminophen-Caffeine (EXCEDRIN PO) Take by mouth as needed.    [provider]  atorvastatin (LIPITOR) 20 MG tablet Take by mouth. 04/13/19   [provider]  Calcium Carbonate-Vit D-Min (CALCIUM 1200 PO) Take by mouth daily.    [provider]  losartan (COZAAR) 25 MG tablet Take by mouth. 08/21/18   [provider]  Multiple Vitamin (MULTIVITAMIN) tablet Take 1 tablet by mouth daily.    [provider]  naproxen (NAPROSYN) 500 MG tablet Take 500 mg by mouth 2 (two) times daily with a meal.    [provider]  ondansetron (ZOFRAN ODT) 4  MG disintegrating tablet Take 1 tablet (4 mg total) by mouth every 8 (eight) hours as needed for nausea or vomiting. 05/04/20   Melynda Ripple, MD  ferrous sulfate 325 (65 FE) MG tablet Take 325 mg by mouth daily with breakfast.  05/04/20  [provider]    Family History Family History  Problem Relation Age of Onset   Breast cancer Sister 40   Hypertension Mother     Social History Social History   Tobacco Use   Smoking status: Never   Smokeless tobacco: Never  Vaping Use   Vaping Use: Never used  Substance Use Topics   Alcohol use: No   Drug use: No     Allergies   Patient has no known allergies.   Review of Systems Review of Systems as noted in HPI.  Other  systems reviewed found be negative   Physical Exam Triage Vital Signs ED Triage Vitals [02/26/23 0835]  Enc Vitals Group     BP 135/79     Pulse Rate 88     Resp (!) 24     Temp      Temp src      SpO2 100 %     Weight      Height      Head Circumference      Peak Flow      Pain Score      Pain Loc      Pain Edu?      Excl. in [place]?    No data found.  Updated Vital Signs BP 135/79 (BP Location: Left Arm)   Pulse 88   Resp (!) 24   SpO2 100%   Visual Acuity Right Eye Distance:   Left Eye Distance:   Bilateral Distance:    Right Eye Near:   Left Eye Near:    Bilateral Near:     Physical Exam Constitutional:      General: She is in acute distress.     Appearance: She is not ill-appearing.     Comments: Intermittent crying, asking for strength, yelling at the doctors tried to kill her with her medication  Cardiovascular:     Rate and Rhythm: Normal rate.  Neurological:     Mental Status: She is disoriented.  Psychiatric:     Comments: Patient with hallucinations, yelling out, thirst, nausea, signs of psychosis      UC Treatments / Results  Labs (all labs ordered are listed, but only abnormal results are displayed) Labs Reviewed - No data to display  EKG   Radiology No results found.  Procedures Procedures (including critical care time)  Medications Ordered in UC Medications - No data to display  Initial Impression / Assessment and Plan / UC Course  I have reviewed the triage vital signs and the nursing notes.  Pertinent labs & imaging results that were available during my care of the patient were reviewed by me and considered in my medical decision making (see chart for details).     As above, patient with no past psych history other than anxiety depression that she is been taking Cymbalta for per her primary care.  Husband reports concern with the estradiol cream since that was just recently started but advised him that that is a localized  medication and does not cause any systemic reactions and that the timing is coincidental.  Patient without any changes at home, no recent traumas no upsetting news from home, no other changes in medications.  Patient  complains of headache, pain across her shoulders, cold hands, stating she is weak and cannot walk and that she needs help.  Patient also reporting being hungry and intermittently being angry at her husband for not giving her anything to eat but also being thirsty requesting water frequently.  Patient will set up crying talking with her eyes mostly closed and then will fall back on the bed like she is passing out.  Concern for acute psychotic break in this patient with no past medical history of psych issues and no family psych issues per the husband.  Patient needs are outside the scope with this facility so EMS was called to transport patient to the ER.  Patient monitored closely with frequent reassurances until EMS staff arrived.  One consideration would be her doses of Cymbalta 20 mg in setting of her chronic kidney disease last noted stage III  Final Clinical Impressions(s) / UC Diagnoses   Final diagnoses:  Psychosis, unspecified psychosis type  Hallucinations  Anxiety and depression   Discharge Instructions   None    ED Prescriptions   None    PDMP not reviewed this encounter.   Pt seen along with Dr. Boykin Reaper who was asked to step in and assist with patient evaluation.  Luvenia Redden, PA-C 02/26/23 0909    Luvenia Redden, PA-C 02/26/23 [phone]

## 2023-02-26 NOTE — ED Notes (Signed)
Hospital meal provided, pt tolerated w/o complaints.  Waste discarded appropriately.  

## 2023-02-26 NOTE — ED Notes (Signed)
Spanish interpretor via alaris used - interpretor Christy Sartorius for assessment

## 2023-02-26 NOTE — ED Notes (Signed)
Patient is being discharged from the Urgent Care and sent to the Emergency Department via EMS . Per Denman George, PA patient is in need of higher level of care due to acute psychosis epsiode. Patient is aware and verbalizes understanding of plan of care.  Vitals:   02/26/23 0835  BP: 135/79  Pulse: 88  Resp: (!) 24  SpO2: 100%

## 2023-02-26 NOTE — ED Triage Notes (Signed)
Pt presents in distress and crying (using video interpreter) stating she has a headache, cold hands and can not walk. Pt states "My doctor is trying to kill me and the TV is talking to me".Her husband states she received cream for vaginal dryness and he believes this is what is causing her symptoms.

## 2023-02-26 NOTE — ED Notes (Signed)
VOLUNTARY pending dispo

## 2023-02-26 NOTE — BH Assessment (Signed)
Comprehensive Clinical Assessment (CCA) Screening, Triage and Referral Note  02/26/2023 Robin Schultz [id number]  [place] Cage, 62 year old female who presents to [facility] ED voluntarily for treatment. Per triage note, Pt to ED [facility] from [facility] urgent care for crying hysterically. Pt started new vaginal cream recently for vaginal dryness. Started Celexa in February. Hallucinating at home that people were out to get her. Denies psych hx.   During TTS assessment pt presents alert and oriented x 4, restless but cooperative, and mood-congruent with affect. The pt does not appear to be responding to internal or external stimuli. Neither is the pt presenting with any delusional thinking. Pt verified the information provided to triage RN.   Spanish interpreter was used during this interview. Pt identifies her main complaint to be that she has not been feeling well. Patient states she initially thought she was prescribed depression medication to be taken as needed; however, her provider stated she needed to take the medication daily. Patient states once she started taking the medication every day, it started making her feel "crazy". "I felt like I was being drugged." Patient reports she was not able to walk. "It was the worst day of my life." Patient reports having worsening symptoms of anxiousness and restlessness. Patient states she has not slept in 3 days; however, her appetite is fine. Patient denies using any illicit substances and alcohol.  Pt denies current SI/HI. Patient reports just recently she was up late at night and thought she heard voices from the television and it was turned off. Patient was tearful describing this experience and reports it really scared her. Patient also mentioned she was prescribed a hormonal cream for vaginal dryness that has since intensified her mental health. "I am doing things that I do not normally do. I do not feel like myself."    Per Jerene Pitch, NP: Collateral: Pamalee Leyden (husband)  at bedside. Denies any history of psychiatric issues. Endorses increased worry regarding mother, says had surgery (hysterectomy) in [place]. Reports patient has not slept a full nights sleep in days but became worried last night when he was awakened in the middle of the night to her putting her ear to the television due to voices. Denies any safety concerns otherwise.   Per Jerene Pitch, NP, pt is recommended for overnight observation to be reassessed in the morning.   Chief Complaint:  Chief Complaint  Patient presents with   Psychiatric Evaluation   Visit Diagnosis: Hallucinations  Patient Reported Information How did you hear about [place]? Self  What Is the Reason for Your Visit/Call Today? Hallucinations  How Long Has This Been Causing You Problems? <Week  What Do You Feel Would Help You the Most Today? Treatment for Depression or other mood problem   Have You Recently Had Any Thoughts About Hurting Yourself? No  Are You Planning to Commit Suicide/Harm Yourself At This time? No   Have you Recently Had Thoughts About [place]? No  Are You Planning to Harm Someone at This Time? No  Explanation: No data recorded  Have You Used Any Alcohol or Drugs in the Past 24 Hours? No  How Long Ago Did You Use Drugs or Alcohol? No data recorded What Did You Use and How Much? No data recorded  Do You Currently Have a Therapist/Psychiatrist? No  Name of Therapist/Psychiatrist: No data recorded  Have You Been Recently Discharged From Any Office Practice or Programs? No  Explanation of Discharge From Practice/Program: No data recorded   CCA  Screening Triage Referral Assessment Type of Contact: Face-to-Face  Telemedicine Service Delivery:   Is this Initial or Reassessment?   Date Telepsych consult ordered in [facility]:    Time Telepsych consult ordered in [facility]:    Location of Assessment: [facility] ED  Provider Location: [facility] ED    Collateral Involvement: None provided   Does Patient  Have a [place]? No data recorded Name and Contact of Legal Guardian: No data recorded If Minor and Not Living with Parent(s), Who has Custody? No data recorded Is CPS involved or ever been involved? Never  Is APS involved or ever been involved? Never   Patient Determined To Be At Risk for Harm To Self or Others Based on Review of Patient Reported Information or Presenting Complaint? No  Method: No Plan  Availability of Means: No data recorded Intent: Vague intent or NA  Notification Required: No data recorded Additional Information for Danger to Others Potential: No data recorded Additional Comments for Danger to Others Potential: No data recorded Are There Guns or Other Weapons in Your Home? No data recorded Types of Guns/Weapons: No data recorded Are These Weapons Safely Secured?                            No data recorded Who Could Verify You Are Able To Have These Secured: No data recorded Do You Have any Outstanding Charges, Pending Court Dates, Parole/Probation? No data recorded Contacted To Inform of Risk of Harm To Self or Others: No data recorded  Does Patient Present under Involuntary Commitment? No    [place] of Residence: [place]   Patient Currently Receiving the Following Services: Medication Management   Determination of Need: Emergent (2 hours)   Options For Referral: ED Visit; Therapeutic Triage Services; Medication Management   Discharge Disposition:     Eula Fried, Counselor, LCAS-A

## 2023-02-26 NOTE — ED Notes (Signed)
Pt belongings include 1 shirt, 1 pants

## 2023-02-26 NOTE — ED Provider Notes (Signed)
[facility] Provider Note    Event Date/Time   First MD Initiated Contact with Patient 02/26/23 [phone]     (approximate)   History   Chief Complaint Psychiatric Evaluation   HPI  Robin Schultz is a 62 y.o. female with past medical history of hyperlipidemia, CKD, and depression who presents to the ED for psychiatric evaluation.  Patient is Spanish-speaking only and history obtained via in person interpreter.  Patient reports that she has been dealing with pain in the left side of her neck for the past 2 weeks, now with increasing headache since she started taking estradiol cream for vaginal dryness 2 days ago.  She has also been feeling increasingly sweaty and jittery, with a cool feeling in her hands.  Husband notes that she has been increasingly upset and tearful over the past 24 hours, when he checked on her this morning she was hearing voices from the TV.  Patient is unable to state what the voices were saying to her, she denies any suicidal or homicidal ideation.  She reports taking medication for depression, but denies any recent changes in her medications other than the topical estradiol.  She has not had any fevers, chest pain, shortness of breath, dysuria, or focal numbness/weakness.     Physical Exam   Triage Vital Signs: ED Triage Vitals  Enc Vitals Group     BP      Pulse      Resp      Temp      Temp src      SpO2      Weight      Height      Head Circumference      Peak Flow      Pain Score      Pain Loc      Pain Edu?      Excl. in [place]?     Most recent vital signs: Vitals:   02/26/23 0921  BP: 139/70  Pulse: 80  Resp: (!) 22  Temp: 98.4 F (36.9 C)  SpO2: 99%    Constitutional: Alert and oriented, upset and tearful. Eyes: Conjunctivae are normal. Head: Atraumatic. Nose: No congestion/rhinnorhea. Mouth/Throat: Mucous membranes are moist.  Neck: Supple with no meningismus. Cardiovascular: Normal rate, regular rhythm. Grossly  normal heart sounds.  2+ radial pulses bilaterally. Respiratory: Normal respiratory effort.  No retractions. Lungs CTAB. Gastrointestinal: Soft and nontender. No distention. Musculoskeletal: No lower extremity tenderness nor edema.  Neurologic:  Normal speech and language. No gross focal neurologic deficits are appreciated.    ED Results / Procedures / Treatments   Labs (all labs ordered are listed, but only abnormal results are displayed) Labs Reviewed  COMPREHENSIVE METABOLIC PANEL - Abnormal; Notable for the following components:      Result Value   Glucose, Bld 113 (*)    BUN 23 (*)    Creatinine, Ser 1.54 (*)    GFR, Estimated 39 (*)    All other components within normal limits  SALICYLATE LEVEL - Abnormal; Notable for the following components:   Salicylate Lvl [id number] (*)    All other components within normal limits  ACETAMINOPHEN LEVEL - Abnormal; Notable for the following components:   Acetaminophen (Tylenol), Serum <10 (*)    All other components within normal limits  CBC - Abnormal; Notable for the following components:   RBC 3.63 (*)    Hemoglobin 10.6 (*)    HCT 34.1 (*)    All other  components within normal limits  URINALYSIS, ROUTINE W REFLEX MICROSCOPIC - Abnormal; Notable for the following components:   Color, Urine YELLOW (*)    APPearance CLEAR (*)    Hgb urine dipstick LARGE (*)    Protein, ur 100 (*)    Bacteria, UA RARE (*)    All other components within normal limits  CBC WITH DIFFERENTIAL/PLATELET - Abnormal; Notable for the following components:   RBC 3.57 (*)    Hemoglobin 10.7 (*)    HCT 33.9 (*)    All other components within normal limits  URINE CULTURE  ETHANOL  URINE DRUG SCREEN, QUALITATIVE ([facility] ONLY)  TSH  T4, FREE  POC URINE PREG, ED     EKG  ED ECG REPORT I, Blake Divine, the attending physician, personally viewed and interpreted this ECG.   Date: 02/26/2023  EKG Time: 11:27  Rate: 65  Rhythm: normal sinus rhythm  Axis:  Normal  Intervals:none  ST&T Change: None  RADIOLOGY CT head reviewed and interpreted by me with no hemorrhage or midline shift.  PROCEDURES:  Critical Care performed: No  Procedures   MEDICATIONS ORDERED IN ED: Medications - No data to display   IMPRESSION / MDM / [place] / ED COURSE  I reviewed the triage vital signs and the nursing notes.                              62 y.o. female with past medical history of hyperlipidemia, CKD, and depression who presents to the ED with 2 weeks of neck pain now with 2 days of increasing headache, jitters, anxiety, and hallucinations.  Patient's presentation is most consistent with acute presentation with potential threat to life or bodily function.  Differential diagnosis includes, but is not limited to, intracranial process, meningitis, electrolyte abnormality, AKI, hyperthyroidism, medication effect, psychosis, depression.  Patient nontoxic-appearing and in no acute distress, vital signs are unremarkable.  Patient is awake and alert with no focal neurologic deficits, but will frequently become upset and tearful.  She reports hearing voices and felt like the TV was speaking to her earlier, but denies any suicidal homicidal ideation.  With new onset psychosis, we will check CT head and also screening labs for contributing process including thyroid studies.  If this workup is unremarkable, patient would benefit from psychiatric evaluation.   CT head is negative for acute process, labs show stable chronic kidney disease with no acute electrolyte abnormality and no significant anemia or leukocytosis noted.  Pregnancy testing is negative and urinalysis shows no signs of infection, thyroid studies were also unremarkable.  Patient may be medically cleared for psychiatric disposition.  The patient has been placed in psychiatric observation due to the need to provide a safe environment for the patient while obtaining psychiatric  consultation and evaluation, as well as ongoing medical and medication management to treat the patient's condition.  The patient has not been placed under full IVC at this time.       FINAL CLINICAL IMPRESSION(S) / ED DIAGNOSES   Final diagnoses:  Hallucinations  Acute nonintractable headache, unspecified headache type     Rx / DC Orders   ED Discharge Orders     None        Note:  This document was prepared using Dragon voice recognition software and may include unintentional dictation errors.   Blake Divine, MD 02/26/23 [phone]

## 2023-02-26 NOTE — ED Notes (Signed)
When assessing pt, behaviors were normal and then as behavioral health questions were asked pt began to raise arms and fixate on finger

## 2023-02-27 LAB — LIPID PANEL
Cholesterol: 185 mg/dL (ref 0–200)
HDL: 93 mg/dL (ref >40)
LDL Cholesterol: 75 mg/dL (ref 0–99)
Total CHOL/HDL Ratio: 2 RATIO
Triglycerides: 83 mg/dL (ref <150)
VLDL: 17 mg/dL (ref 0–40)

## 2023-02-27 MED ORDER — OLANZAPINE 5 MG PO TBDP
5.0000 mg | ORAL_TABLET | Freq: Every day | ORAL | Status: DC
  Administered 2023-02-27: 5 mg via ORAL
  Filled 2023-02-27: qty 1

## 2023-02-27 MED ORDER — HYDROXYZINE HCL 25 MG PO TABS: 25.0000 mg | ORAL_TABLET | Freq: Three times a day (TID) | ORAL | 0 refills | Status: AC | PRN

## 2023-02-27 MED ORDER — OLANZAPINE 5 MG PO TBDP: 5.0000 mg | ORAL_TABLET | Freq: Every day | ORAL | 0 refills | Status: DC

## 2023-02-27 MED ORDER — TRAZODONE HCL 50 MG PO TABS: 50.0000 mg | ORAL_TABLET | Freq: Every evening | ORAL | 0 refills | Status: DC | PRN

## 2023-02-27 NOTE — ED Notes (Signed)
Pt awake and to restroom 

## 2023-02-27 NOTE — ED Notes (Signed)
Pt tearful in bed, prn provided after pt had requested for headache. Pt in bed and is crying, provided med, pt questions and does not want but one tablet, explained why there were two. Pt then asks where her husband is and where she is, with each answer she has a very animated response of shock and disappointment. Pt repeatedly stating no medicine and crying while nurse attempts to communicate wit pt.

## 2023-02-27 NOTE — ED Notes (Signed)
Robin Schultz [id number] interpretor used to discuss discharge papers. E-signature not working at this time. Pt verbalized understanding of D/C instructions, prescriptions and follow up care with no further questions at this time. Pt in NAD and ambulatory at time of D/C. Belongings bag 2/2 returned to patient at time of discharge. Husband at bedside at this time.

## 2023-02-27 NOTE — ED Notes (Signed)
Pt in bed crying, complaining of head pain. RN notified.

## 2023-02-27 NOTE — Discharge Summary (Signed)
Physician Discharge Summary Note  Patient:  Robin Schultz is an 62 y.o., female MRN:  [id number] DOB:  03/28/1961 Patient phone:  [phone] (home)  Patient address:   [address] [place] [address],  Total Time spent with patient: 45 minutes  Date of Admission:  02/26/2023 Date of Discharge: 02/27/2023  Reason for Admission:  Robin Schultz is a 62 y.o. female patient with past psychiatric history of major depression disorder who presented to [facility] 02/26/23 via husband for increased depression symptoms, insomnia, and recent onset of hallucinations.   Patient was initially assessed via tele-interpreter services where she endorsed increased stress and sadness related to being away from her ill mother and unable to visit her in [place]. She reported x3 days without any sleep and increased anxiety affecting her ability to think and overall functioning.   Assessment completed via telepsych interpreter Anderson Malta [phone]). On today's assessment patient presents alert and oriented, calm and cooperative. Sitting on side of the bed with her husband at the bedside. Casual appearance. Stable eye contact. Euthymic mood, congruent affect. Affect much brighter than day prior. No visible distress noted. She reports 'excellent' sleep last night. Denies any SI/HI/AVH. Provider discussed nurse reports that she woke up crying during the night, complaining of headache; she endorses having a headache and possibly crying but denies any hallucinations during the night. States she feels 'much better' and is requesting discharge to 'continue to heal at home with my husband'. Provider discussed medications given in ED in which she is amenable to continuing upon discharge. Contracts for safety with plan to follow up with outpatient provider.   Collateral: Robin Schultz (husband) at bedside Husband reports feeling patient is at her baseline and is requesting discharge. He denies any safety concerns and endorses an  understanding of current medications prescribed, scheduling.   Principal Problem: Severe recurrent major depressive disorder with psychotic features Discharge Diagnoses: Principal Problem:   Severe recurrent major depressive disorder with psychotic features   Past Psychiatric History: major depressive disorder  Past Medical History:  Past Medical History:  Diagnosis Date   Chronic kidney disease (CKD), stage III (moderate)    Headache    Hyperlipidemia    Renal cyst    Scoliosis     Past Surgical History:  Procedure Laterality Date   APPENDECTOMY     AUGMENTATION MAMMAPLASTY Bilateral 2013   PTERYGIUM EXCISION     TONSILLECTOMY     Transumblical Augmentation mammaplasty     Family History:  Family History  Problem Relation Age of Onset   Breast cancer Sister 65   Hypertension Mother    Family Psychiatric  History: pt denies any Social History:  Social History   Substance and Sexual Activity  Alcohol Use No     Social History   Substance and Sexual Activity  Drug Use No    Social History   Socioeconomic History   Marital status: Married    Spouse name: Not on file   Number of children: Not on file   Years of education: Not on file   Highest education level: Not on file  Occupational History   Not on file  Tobacco Use   Smoking status: Never   Smokeless tobacco: Never  Vaping Use   Vaping Use: Never used  Substance and Sexual Activity   Alcohol use: No   Drug use: No   Sexual activity: Not on file  Other Topics Concern   Not on file  Social History Narrative  Not on file   Social Determinants of Health   Financial Resource Strain: Not on file  Food Insecurity: Not on file  Transportation Needs: Not on file  Physical Activity: Not on file  Stress: Not on file  Social Connections: Not on file    Hospital Course:   HOSPITAL COURSE:  During the patient's hospitalization, patient had extensive initial psychiatric evaluation, and follow-up  psychiatric evaluations every day.  Psychiatric diagnoses provided upon initial assessment: auditory hallucinations, major depressive disorder with psychotic features  Patient's psychiatric medications were adjusted on admission:  - Celexa was discontinued due to onset of auditory hallucinations - Trazodone 50 mg PO HS started for insomnia and depression symptoms - Olanzapine 5 mg daily started for psychotic features  During the hospitalization, other adjustments were made to the patient's psychiatric medication regimen:  - Celexa was discontinued due to onset of auditory hallucinations  Patient's care was discussed during the interdisciplinary team meeting every day during the hospitalization.  The patient denies having side effects to prescribed psychiatric medication.  Gradually, patient started adjusting to milieu. The patient was evaluated each day by a clinical provider to ascertain response to treatment. Improvement was noted by the patient's report of decreasing symptoms, improved sleep and appetite, affect, medication tolerance, behavior, and participation in unit programming.  Patient was asked each day to complete a self inventory noting mood, mental status, pain, new symptoms, anxiety and concerns.    Symptoms were reported as significantly decreased or resolved completely by discharge.   On day of discharge, the patient reports that their mood is stable. The patient denied having suicidal thoughts for more than 48 hours prior to discharge.  Patient denies having homicidal thoughts.  Patient denies having auditory hallucinations.  Patient denies any visual hallucinations or other symptoms of psychosis. The patient was motivated to continue taking medication with a goal of continued improvement in mental health.   The patient reports their target psychiatric symptoms of hallucinations responded well to the psychiatric medications, and the patient reports overall benefit of  psychiatric hospitalization. Supportive psychotherapy was provided to the patient.   Labs were reviewed with the patient, and abnormal results were discussed with the patient.  The patient is able to verbalize their individual safety plan to this provider via interpreter services.  # It is recommended to the patient to continue psychiatric medications as prescribed, after discharge from the hospital.    # It is recommended to the patient to follow up with your outpatient psychiatric provider and PCP.  # It was discussed with the patient, the impact of alcohol, drugs, tobacco have been there overall psychiatric and medical wellbeing, and total abstinence from substance use was recommended the patient.ed.  # Prescriptions provided or sent directly to preferred pharmacy at discharge. Patient agreeable to plan. Given opportunity to ask questions. Appears to feel comfortable with discharge.    # In the event of worsening symptoms, the patient is instructed to call the crisis hotline, 911 and or go to the nearest ED for appropriate evaluation and treatment of symptoms. To follow-up with primary care provider for other medical issues, concerns and or health care needs  # Patient was discharged home with her husband per request with a plan to follow up as noted below.   Physical Findings: AIMS:  , ,  ,  ,    CIWA:    COWS:  negative  Musculoskeletal: Strength & Muscle Tone: within normal limits Gait & Station: normal Patient leans: N/A  Psychiatric Specialty Exam:  Presentation  General Appearance:  Casual  Eye Contact: Fair  Speech: Other (comment) (spanish speaking)  Speech Volume: Normal  Handedness: Right   Mood and Affect  Mood: Labile  Affect: Tearful  Thought Process  Thought Processes: Linear  Descriptions of Associations:Circumstantial  Orientation:Partial  Thought Content:Logical  History of Schizophrenia/Schizoaffective disorder:No data  recorded Duration of Psychotic Symptoms:No data recorded Hallucinations:Hallucinations: Auditory Description of Auditory Hallucinations: voices  Ideas of Reference:None  Suicidal Thoughts:Suicidal Thoughts: No  Homicidal Thoughts:Homicidal Thoughts: No  Sensorium  Memory: Immediate Fair  Judgment: Fair  Insight: Lacking   Executive Functions  Concentration: Fair  Attention Span: Fair  Recall: [place] of Knowledge: Fair  Language: Fair   Psychomotor Activity  Psychomotor Activity: Psychomotor Activity: Normal   Assets  Assets: Physical Health; Resilience; Social Support   Sleep  Sleep: Sleep: Poor    Physical Exam: Physical Exam Vitals and nursing note reviewed.  Constitutional:      Appearance: She is normal weight.  HENT:     Head: Normocephalic.     Nose: Nose normal.     Mouth/Throat:     Mouth: Mucous membranes are moist.     Pharynx: Oropharynx is clear.  Abdominal:     Palpations: Abdomen is soft.  Neurological:     Mental Status: She is alert.  Psychiatric:        Attention and Perception: She does not perceive auditory or visual hallucinations.        Mood and Affect: Mood and affect normal.        Speech: Speech normal.        Behavior: Behavior is cooperative.        Thought Content: Thought content is not paranoid or delusional. Thought content does not include homicidal or suicidal ideation. Thought content does not include homicidal or suicidal plan.        Judgment: Judgment normal.    Review of Systems  Psychiatric/Behavioral:  Negative for hallucinations, substance abuse and suicidal ideas.    Blood pressure 108/76, pulse 80, temperature 97.9 F (36.6 C), temperature source Oral, resp. rate 17, SpO2 99 %. There is no height or weight on file to calculate BMI.   Social History   Tobacco Use  Smoking Status Never  Smokeless Tobacco Never   Tobacco Cessation:  N/A, patient does not currently use tobacco  products   Blood Alcohol level:  Lab Results  Component Value Date   ETH <10 [id number]    Metabolic Disorder Labs:  No results found for: "HGBA1C", "MPG" No results found for: "PROLACTIN" Lab Results  Component Value Date   CHOL 185 02/26/2023   TRIG 83 02/26/2023   HDL 93 02/26/2023   CHOLHDL 2.0 02/26/2023   VLDL 17 02/26/2023   LDLCALC 75 02/26/2023    See Psychiatric Specialty Exam and Suicide Risk Assessment completed by Attending Physician prior to discharge.  Discharge destination:  Home  Is patient on multiple antipsychotic therapies at discharge:  No   Has Patient had three or more failed trials of antipsychotic monotherapy by history:  No  Recommended Plan for Multiple Antipsychotic Therapies: NA   Allergies as of 02/27/2023   No Known Allergies      Medication List     STOP taking these medications    citalopram 20 MG tablet Commonly known as: CELEXA       TAKE these medications      Indication  atorvastatin 20 MG tablet Commonly known  as: LIPITOR Take by mouth.    CALCIUM 1200 PO Take by mouth daily.    EXCEDRIN PO Take by mouth as needed.    hydrOXYzine 25 MG tablet Commonly known as: ATARAX Take 1 tablet (25 mg total) by mouth 3 (three) times daily as needed for anxiety.    losartan 25 MG tablet Commonly known as: COZAAR Take by mouth.    multivitamin tablet Take 1 tablet by mouth daily.    naproxen 500 MG tablet Commonly known as: NAPROSYN Take 500 mg by mouth 2 (two) times daily with a meal.    OLANZapine zydis 5 MG disintegrating tablet Commonly known as: ZYPREXA Take 1 tablet (5 mg total) by mouth daily for 10 days. Start taking on: February 28, 2023  Indication: Psychotic Depressive Illness   olmesartan 40 MG tablet Commonly known as: BENICAR Take 40 mg by mouth daily.    ondansetron 4 MG disintegrating tablet Commonly known as: Zofran ODT Take 1 tablet (4 mg total) by mouth every 8 (eight) hours as needed for  nausea or vomiting.    traZODone 50 MG tablet Commonly known as: DESYREL Take 1 tablet (50 mg total) by mouth at bedtime as needed for sleep.  Indication: Trouble Sleeping        Follow-up recommendations:   Activity: As tolerated  Diet: Heart healthy  Other: -Follow-up with your outpatient psychiatric provider -instructions on appointment date, time, and address (location) are provided to you in discharge paperwork.   -Take your psychiatric medications as prescribed at discharge - instructions are provided to you in the discharge paperwork.    -Follow-up with outpatient primary care doctor and other specialists -for management of preventative medicine and chronic medical disease, including:   -Testing: Follow-up with outpatient provider for abnormal lab results:  none   -Recommend abstinence from alcohol, tobacco, and other illicit drug use at discharge.    -If your psychiatric symptoms recur, worsen, or if you have side effects to your psychiatric medications, call your outpatient psychiatric provider, 911, [phone] or go to the nearest emergency department.   -If suicidal thoughts recur, call your outpatient psychiatric provider, 911, [phone] or go to the nearest emergency department.  Comments:    Signed: Inda Merlin, NP 02/27/2023, 10:52 AM

## 2023-02-28 LAB — URINE CULTURE: Culture: 30000 — AB

## 2023-03-01 ENCOUNTER — Emergency Department
Admission: EM | Admit: 2023-03-01 | Discharge: 2023-03-04 | Disposition: A | Discharge status: Other Acute Inpatient Hospital | Attending: Emergency Medicine | Admitting: Emergency Medicine

## 2023-03-01 ENCOUNTER — Other Ambulatory Visit: Payer: Self-pay

## 2023-03-01 DIAGNOSIS — F331 Major depressive disorder, recurrent, moderate: Secondary | ICD-10-CM

## 2023-03-01 DIAGNOSIS — Z1152 Encounter for screening for COVID-19: Secondary | ICD-10-CM | POA: Diagnosis not present

## 2023-03-01 DIAGNOSIS — F29 Unspecified psychosis not due to a substance or known physiological condition: Secondary | ICD-10-CM

## 2023-03-01 DIAGNOSIS — F333 Major depressive disorder, recurrent, severe with psychotic symptoms: Secondary | ICD-10-CM | POA: Diagnosis present

## 2023-03-01 DIAGNOSIS — N183 Chronic kidney disease, stage 3 unspecified: Secondary | ICD-10-CM | POA: Insufficient documentation

## 2023-03-01 DIAGNOSIS — R441 Visual hallucinations: Secondary | ICD-10-CM | POA: Diagnosis present

## 2023-03-01 DIAGNOSIS — F419 Anxiety disorder, unspecified: Secondary | ICD-10-CM | POA: Diagnosis not present

## 2023-03-01 NOTE — ED Triage Notes (Addendum)
Pt to ED via POV c/o visual and auditory hallucinations. Pt was here for same thing a few days ago. Pt saying that she was seeing shadows at doors and that when she closes her eyes she sees some woods. Pt reports she has been taking her medications as prescribed. Pt rambling in triage. Denies SI, HI. Pt also complaining of leg pain

## 2023-03-01 NOTE — ED Notes (Addendum)
Pt belongings placed in belongings bag. Dressed out by this Education officer, community. Given to husband Robin Schultz [employer] Polka dot shirt Polka dot vpants Pink bra Purple slippers Black socks Blue underwear Gold bracelet Gold earring  Silver and blue earrings

## 2023-03-02 DIAGNOSIS — F333 Major depressive disorder, recurrent, severe with psychotic symptoms: Secondary | ICD-10-CM

## 2023-03-02 LAB — URINE DRUG SCREEN, QUALITATIVE (ARMC ONLY)
Amphetamines, Ur Screen: NOT DETECTED
Barbiturates, Ur Screen: NOT DETECTED
Benzodiazepine, Ur Scrn: NOT DETECTED
Cannabinoid 50 Ng, Ur ~~LOC~~: NOT DETECTED
Cocaine Metabolite,Ur ~~LOC~~: NOT DETECTED
MDMA (Ecstasy)Ur Screen: NOT DETECTED
Methadone Scn, Ur: NOT DETECTED
Opiate, Ur Screen: NOT DETECTED
Phencyclidine (PCP) Ur S: NOT DETECTED
Tricyclic, Ur Screen: NOT DETECTED

## 2023-03-02 LAB — COMPREHENSIVE METABOLIC PANEL
ALT: 19 U/L (ref 0–44)
AST: 32 U/L (ref 15–41)
Albumin: 4.2 g/dL (ref 3.5–5.0)
Alkaline Phosphatase: 73 U/L (ref 38–126)
Anion gap: 11 (ref 5–15)
BUN: 28 mg/dL — ABNORMAL HIGH (ref 6–20)
CO2: 24 mmol/L (ref 22–32)
Calcium: 9.1 mg/dL (ref 8.9–10.3)
Chloride: 103 mmol/L (ref 98–111)
Creatinine, Ser: 1.46 mg/dL — ABNORMAL HIGH (ref 0.44–1.00)
GFR, Estimated: 41 mL/min — ABNORMAL LOW (ref >60)
Glucose, Bld: 102 mg/dL — ABNORMAL HIGH (ref 70–99)
Potassium: 4.1 mmol/L (ref 3.5–5.1)
Sodium: 138 mmol/L (ref 135–145)
Total Bilirubin: 0.5 mg/dL (ref 0.3–1.2)
Total Protein: 7.1 g/dL (ref 6.5–8.1)

## 2023-03-02 LAB — CBC
HCT: 31.7 % — ABNORMAL LOW (ref 36.0–46.0)
Hemoglobin: 9.7 g/dL — ABNORMAL LOW (ref 12.0–15.0)
MCH: 29.8 pg (ref 26.0–34.0)
MCHC: 30.6 g/dL (ref 30.0–36.0)
MCV: 97.5 fL (ref 80.0–100.0)
Platelets: 229 10*3/uL (ref 150–400)
RBC: 3.25 MIL/uL — ABNORMAL LOW (ref 3.87–5.11)
RDW: 14.5 % (ref 11.5–15.5)
WBC: 6.3 10*3/uL (ref 4.0–10.5)
nRBC: 0 % (ref 0.0–0.2)

## 2023-03-02 LAB — ACETAMINOPHEN LEVEL: Acetaminophen (Tylenol), Serum: <10 ug/mL — ABNORMAL LOW (ref 10–30)

## 2023-03-02 LAB — RESP PANEL BY RT-PCR (RSV, FLU A&B, COVID)  RVPGX2
Influenza A by PCR: NEGATIVE
Influenza B by PCR: NEGATIVE
Resp Syncytial Virus by PCR: NEGATIVE
SARS Coronavirus 2 by RT PCR: NEGATIVE

## 2023-03-02 LAB — ETHANOL: Alcohol, Ethyl (B): <10 mg/dL (ref <10)

## 2023-03-02 LAB — SALICYLATE LEVEL: Salicylate Lvl: <7 mg/dL — ABNORMAL LOW (ref 7.0–30.0)

## 2023-03-02 MED ORDER — LORAZEPAM 1 MG PO TABS
1.0000 mg | ORAL_TABLET | Freq: Once | ORAL | Status: AC
  Administered 2023-03-02: 1 mg via ORAL
  Filled 2023-03-02: qty 1

## 2023-03-02 MED ORDER — ESCITALOPRAM OXALATE 10 MG PO TABS
5.0000 mg | ORAL_TABLET | Freq: Every day | ORAL | Status: DC
  Administered 2023-03-02 – 2023-03-04 (×3): 5 mg via ORAL
  Filled 2023-03-02 (×3): qty 1

## 2023-03-02 MED ORDER — LIDOCAINE 5 % EX PTCH
1.0000 | MEDICATED_PATCH | CUTANEOUS | Status: DC
  Administered 2023-03-02 – 2023-03-04 (×3): 1 via TRANSDERMAL
  Filled 2023-03-02 (×4): qty 1

## 2023-03-02 MED ORDER — GABAPENTIN 100 MG PO CAPS: 100.0000 mg | ORAL_CAPSULE | Freq: Three times a day (TID) | ORAL | Status: DC | PRN

## 2023-03-02 MED ORDER — RISPERIDONE 1 MG PO TABS
0.5000 mg | ORAL_TABLET | Freq: Every day | ORAL | Status: DC
  Administered 2023-03-02 – 2023-03-04 (×3): 0.5 mg via ORAL
  Filled 2023-03-02 (×3): qty 1

## 2023-03-02 NOTE — ED Notes (Signed)
Pt up to bathroom.  Returned to bed and making statements that are incoherent.   Back in her chair and she is talking to herself and crying for her husband.  Pt told he will be back around lunchtime for visitation.  Continues to cry.  Expresses no needs at this time

## 2023-03-02 NOTE — Consult Note (Signed)
[facility] Face-to-Face Psychiatry Consult   Reason for Consult:  hallucinations Referring Physician:  EDP Patient Identification: Robin Schultz MRN:  [id number] Principal Diagnosis: Severe recurrent major depressive disorder with psychotic features Diagnosis:  Principal Problem:   Severe recurrent major depressive disorder with psychotic features   Total Time spent with patient: 45 minutes  Subjective:   The client returned to the ED with psychosis, insomnia, and seeing things at times.  These symptoms appeared to have been triggered by the use of estrogen with her husband feeling she may have taken too much.  She reported she has not slept since taking this past Saturday except 3 hours per night.  Earlier this week she called her PCP with vomiting and hot flashes after using the cream.  She has been having agitation r/t lack of sleep.  Depression is present at a moderate to high level with no suicidal ideations or past attempts or hospitalizations.  No substance use past or present.  Inpatient hospitalization recommended to assist in alleviating her symptoms.    02/26/2023 assessment on first ED presentation per Nehemiah Settle Levy-Johnson: Robin Schultz is a 62 y.o. female patient with past psychiatric history of major depression disorder who presented to [facility] via husband for increased depression symptoms, insomnia, and recent onset of hallucinations.   Patient observed laying in bed with covers over her face. Interpreter used for this assessment [facility] [phone]). Patient partially alert; oriented to person, place 'hospital', and situation. Reports reason for admission as 'I didn't feel well. Couldn't walk. Felt like my life was leaving me'. She initially appeared possibly paranoid wondering if tele-interpreter was recording conversation asking for a copy once assessment was complete; provider took time to reassure patient that interpreter was the same as if she were in person and no longer mentioned being recorded.    She reports no sleep x 2 nights; 'it's agitated me because I can't sleep'. She became tearful intermittently throughout assessment saying she is originally from [place] and her mother is sick. Says she usually visits annually but will not be able to take the trip this year due to finances and feels this is when her mother 'needs' her most. Says as a result she is worrying more and crying feeling guilty. She reports poor sleep, anhedonia, some feelings of guilt, denies any feelings of worthlessness, 'pretty bad' concentration with increased forgetfulness, normal appetite, some psychomotor agitation. She denies any suicidal or homicidal ideations. Says her gynecologist recently prescribed a hormonal cream for her vaginal dryness that caused intense hot flashes and overall discomfort; feels made her feel worse mentally. Mentions her doctor doctor prescribed medications for depression in December that she took 'when feeling depressed' and during follow up in February was told the proper way to take medication (daily) in which she describes has since made her feel worse. Stopped taking medications a few days ago due to increased symptoms. States last night she attempted to sleep when she woke up to use the bathroom and went to make sure the television was turned off when she noticed it was turned off she could hear 'voices' as if it were still on which frightened her. Says she put her ear to the television to see if the voices were coming from the television; she denies receiving any messages or commands and describes the voices as 'I couldn't understand, just talking'. She reports feeling safe at home and the hospital. Identifies husband as active support. Discussed concerns with onset of voices and recommendation for overnight  observation with some medications to treat insomnia; patient amenable to treatment for insomnia only at this time. She has requested not to restart Celexa or any other medications unless  'necessary'.    Collateral: Elmer BalesWayne Prien (husband) at bedside Denies any history of psychiatric issues. Endorses increased worry regarding mother, says had surgery (hysterectomy) in [place]. Reports patient has not slept a full nights sleep in days but became worried last night when he was awakened in the middle of the night to her putting her ear to the television due to voices. Denies any safety concerns otherwise.   Past Psychiatric History: major depressive disorder  Risk to Self: pt denies Risk to Others: pt denies Prior Inpatient Therapy: pt denies Prior Outpatient Therapy: pt denies  Past Medical History:  Past Medical History:  Diagnosis Date   Chronic kidney disease (CKD), stage III (moderate)    Headache    Hyperlipidemia    Renal cyst    Scoliosis     Past Surgical History:  Procedure Laterality Date   APPENDECTOMY     AUGMENTATION MAMMAPLASTY Bilateral 2013   PTERYGIUM EXCISION     TONSILLECTOMY     Transumblical Augmentation mammaplasty     Family History:  Family History  Problem Relation Age of Onset   Breast cancer Sister 2238   Hypertension Mother    Family Psychiatric History: denies any Social History:  Social History   Substance and Sexual Activity  Alcohol Use No     Social History   Substance and Sexual Activity  Drug Use No    Social History   Socioeconomic History   Marital status: Married    Spouse name: Not on file   Number of children: Not on file   Years of education: Not on file   Highest education level: Not on file  Occupational History   Not on file  Tobacco Use   Smoking status: Never   Smokeless tobacco: Never  Vaping Use   Vaping Use: Never used  Substance and Sexual Activity   Alcohol use: No   Drug use: No   Sexual activity: Not on file  Other Topics Concern   Not on file  Social History Narrative   Not on file   Social Determinants of Health   Financial Resource Strain: Not on file  Food Insecurity: Not on  file  Transportation Needs: Not on file  Physical Activity: Not on file  Stress: Not on file  Social Connections: Not on file   Additional Social History:    Allergies:  No Known Allergies  Labs:  Results for orders placed or performed during the hospital encounter of 03/01/23 (from the past 48 hour(s))  cbc     Status: Abnormal   Collection Time: 03/01/23 11:50 PM  Result Value Ref Range   WBC 6.3 4.0 - 10.5 K/uL   RBC 3.25 (L) 3.87 - 5.11 MIL/uL   Hemoglobin 9.7 (L) 12.0 - 15.0 g/dL   HCT 11.931.7 (L) 14.736.0 - 82.946.0 %   MCV 97.5 80.0 - 100.0 fL   MCH 29.8 26.0 - 34.0 pg   MCHC 30.6 30.0 - 36.0 g/dL   RDW 56.214.5 13.011.5 - 86.515.5 %   Platelets 229 150 - 400 K/uL   nRBC 0.0 0.0 - 0.2 %    Comment: Performed at [facility], [address] [place], [place] [address]  Acetaminophen level     Status: Abnormal   Collection Time: 03/01/23 11:50 PM  Result Value Ref Range  Acetaminophen (Tylenol), Serum <10 (L) 10 - 30 ug/mL    Comment: (NOTE) Therapeutic concentrations vary significantly. A range of 10-30 ug/mL  may be an effective concentration for many patients. However, some  are best treated at concentrations outside of this range. Acetaminophen concentrations >150 ug/mL at 4 hours after ingestion  and >50 ug/mL at 12 hours after ingestion are often associated with  toxic reactions.  Performed at [facility], [address] [place], [place] [address]   Comprehensive metabolic panel     Status: Abnormal   Collection Time: 03/01/23 11:50 PM  Result Value Ref Range   Sodium 138 135 - 145 mmol/L   Potassium 4.1 3.5 - 5.1 mmol/L   Chloride 103 98 - 111 mmol/L   CO2 24 22 - 32 mmol/L   Glucose, Bld 102 (H) 70 - 99 mg/dL    Comment: Glucose reference range applies only to samples taken after fasting for at least 8 hours.   BUN 28 (H) 6 - 20 mg/dL   Creatinine, Ser 2.70 (H) 0.44 - 1.00 mg/dL   Calcium 9.1 8.9 - 62.3 mg/dL   Total Protein 7.1 6.5 - 8.1 g/dL    Albumin 4.2 3.5 - 5.0 g/dL   AST 32 15 - 41 U/L   ALT 19 0 - 44 U/L   Alkaline Phosphatase 73 38 - 126 U/L   Total Bilirubin 0.5 0.3 - 1.2 mg/dL   GFR, Estimated 41 (L) >60 mL/min    Comment: (NOTE) Calculated using the CKD-EPI Creatinine Equation (2021)    Anion gap 11 5 - 15    Comment: Performed at [facility], [address] [place], [place] [address]  Salicylate level     Status: Abnormal   Collection Time: 03/01/23 11:50 PM  Result Value Ref Range   Salicylate Lvl <7.0 (L) 7.0 - 30.0 mg/dL    Comment: Performed at [facility], [address] [place], [place] [address]  Ethanol     Status: None   Collection Time: 03/01/23 11:50 PM  Result Value Ref Range   Alcohol, Ethyl (B) <10 <10 mg/dL    Comment: (NOTE) Lowest detectable limit for serum alcohol is 10 mg/dL.  For medical purposes only. Performed at [facility], [address] [place], [place] [address]     Current Facility-Administered Medications  Medication Dose Route Frequency Provider Last Rate Last Admin   lidocaine (LIDODERM) 5 % 1 patch  1 patch Transdermal Q24H Irean Hong, MD   1 patch at 03/02/23 0132   Current Outpatient Medications  Medication Sig Dispense Refill   atorvastatin (LIPITOR) 20 MG tablet Take 20 mg by mouth daily.     losartan (COZAAR) 25 MG tablet Take 25 mg by mouth daily.     Multiple Vitamin (MULTIVITAMIN) tablet Take 1 tablet by mouth daily.     OLANZapine zydis (ZYPREXA) 5 MG disintegrating tablet Take 1 tablet (5 mg total) by mouth daily for 10 days. 10 tablet 0   olmesartan (BENICAR) 40 MG tablet Take 40 mg by mouth daily.     hydrOXYzine (ATARAX) 25 MG tablet Take 1 tablet (25 mg total) by mouth 3 (three) times daily as needed for anxiety. (Patient not taking: Reported on 03/02/2023) 30 tablet 0   ondansetron (ZOFRAN ODT) 4 MG disintegrating tablet Take 1 tablet (4 mg total) by mouth every 8 (eight) hours as needed for nausea or vomiting. (Patient  not taking: Reported on 03/02/2023) 20 tablet 0  traZODone (DESYREL) 50 MG tablet Take 1 tablet (50 mg total) by mouth at bedtime as needed for sleep. (Patient not taking: Reported on 03/02/2023) 10 tablet 0    Musculoskeletal: Strength & Muscle Tone: within normal limits Gait & Station: normal Patient leans: N/A  Psychiatric Specialty Exam: Physical Exam Vitals and nursing note reviewed.  Constitutional:      Appearance: She is normal weight.  HENT:     Head: Normocephalic.     Nose: Nose normal.  Pulmonary:     Effort: Pulmonary effort is normal.  Musculoskeletal:        General: Normal range of motion.     Cervical back: Normal range of motion.  Neurological:     General: No focal deficit present.     Mental Status: She is alert and oriented to person, place, and time.  Psychiatric:        Attention and Perception: She perceives visual hallucinations.        Mood and Affect: Mood is anxious. Affect is tearful.        Speech: Speech normal.        Behavior: Behavior normal. Behavior is cooperative.        Thought Content: Thought content normal. Thought content does not include homicidal or suicidal ideation. Thought content does not include homicidal or suicidal plan.        Cognition and Memory: She exhibits impaired recent memory.        Judgment: Judgment normal.     Review of Systems  Psychiatric/Behavioral:  Positive for depression and hallucinations. The patient is nervous/anxious and has insomnia.     Blood pressure 123/67, pulse 81, temperature 98.2 F (36.8 C), temperature source Oral, resp. rate 18, height 5\' 2"  (1.575 m), weight 59.9 kg, SpO2 97 %.Body mass index is 24.14 kg/m.  General Appearance: Casual  Eye Contact:  Fair  Speech:  Normal Rate  Volume:  Normal  Mood:  Anxious and Depressed  Affect:  Congruent  Thought Process:  hallucinations  Orientation:  Full (Time, Place, and Person)  Thought Content:  WDL and Logical  Suicidal Thoughts:  No   Homicidal Thoughts:  No  Memory:  Immediate;   Fair Recent;   Fair Remote;   Fair  Judgement:  Fair  Insight:  Good  Psychomotor Activity:  Decreased  Concentration:  Concentration: Fair and Attention Span: Fair  Recall:  [employer] of Knowledge:  Good  Language:  Fair  Akathisia:  No  Handed:  Right  AIMS (if indicated):     Assets:  Housing Intimacy Leisure Time Physical Health Resilience Social Support  ADL's:  Intact  Cognition:  WNL  Sleep:        Physical Exam: Physical Exam Vitals and nursing note reviewed.  Constitutional:      Appearance: She is normal weight.  HENT:     Head: Normocephalic.     Nose: Nose normal.  Pulmonary:     Effort: Pulmonary effort is normal.  Musculoskeletal:        General: Normal range of motion.     Cervical back: Normal range of motion.  Neurological:     General: No focal deficit present.     Mental Status: She is alert and oriented to person, place, and time.  Psychiatric:        Attention and Perception: She perceives visual hallucinations.        Mood and Affect: Mood is anxious. Affect is tearful.  Speech: Speech normal.        Behavior: Behavior normal. Behavior is cooperative.        Thought Content: Thought content normal. Thought content does not include homicidal or suicidal ideation. Thought content does not include homicidal or suicidal plan.        Cognition and Memory: She exhibits impaired recent memory.        Judgment: Judgment normal.    Review of Systems  Psychiatric/Behavioral:  Positive for depression and hallucinations. The patient is nervous/anxious and has insomnia.    Blood pressure 123/67, pulse 81, temperature 98.2 F (36.8 C), temperature source Oral, resp. rate 18, height 5\' 2"  (1.575 m), weight 59.9 kg, SpO2 97 %. Body mass index is 24.14 kg/m.  Treatment Plan Summary: Daily contact with patient to assess and evaluate symptoms and progress in treatment, Medication management, and Plan    Major depressive disorder, recurrent, severe with psychosis Risperdal 0.5 mg daily Lexapro 5 mg daily  Anxiety: Gabapentin 100  mg TID PRN  Disposition: Admit to inpatient psych  Nanine Means, NP 03/02/2023 10:58 AM

## 2023-03-02 NOTE — ED Notes (Signed)
Patient is vol pending psych admit 

## 2023-03-02 NOTE — ED Notes (Signed)
vol/pending consult/NP determined pt meets psychiatric inpatient criteria.Marland KitchenMarland Kitchen

## 2023-03-02 NOTE — ED Notes (Signed)
Unable to obtain vitals at this time due to pt being asleep.

## 2023-03-02 NOTE — ED Notes (Signed)
Pt ambulated to the restroom. Was slightly unsteady. Stand by assist. Pt up to her chair and set up with dinner tray.

## 2023-03-02 NOTE — ED Notes (Signed)
ED Provider at bedside. 

## 2023-03-02 NOTE — ED Notes (Signed)
TTS at bedside. 

## 2023-03-02 NOTE — BH Assessment (Signed)
Patient has been accepted to [facility].  Patient assigned to [facility]. Accepting physician is Dr. Betti Cruz.  Call report to [phone].  Representative was [place].   ER Staff is aware of it:  Melody, ER Secretary  Dr. Modesto Charon, ER MD  Alvis Lemmings, Patient's Nurse     Patient can arrive Monday 03/04/23 after 8 AM..

## 2023-03-02 NOTE — ED Notes (Signed)
Meal given

## 2023-03-02 NOTE — ED Provider Notes (Signed)
[facility] Provider Note    Event Date/Time   First MD Initiated Contact with Patient 03/02/23 0030     (approximate)   History   Psychiatric Evaluation   HPI  Robin Schultz is a 62 y.o. female who returns to the ED from home with continued hallucinations.  Patient was seen for similar on 02/26/2023.  Started on olanzapine, trazodone and hydroxyzine which, according to a televisit 2 days ago, her husband states she refuses to take.  States patient continues to hallucinate, thinking the TV is talking to her.  Telephones members of her family for hours and hours.  Cleaning the house in the middle of the night.  Patient denies active SI/HI.  Notes chronic left-sided neck pain.  Denies recent trauma or injury.  Has no other medical complaints.     Past Medical History   Past Medical History:  Diagnosis Date   Chronic kidney disease (CKD), stage III (moderate)    Headache    Hyperlipidemia    Renal cyst    Scoliosis      Active Problem List   Patient Active Problem List   Diagnosis Date Noted   Severe recurrent major depressive disorder with psychotic features 02/26/2023     Past Surgical History   Past Surgical History:  Procedure Laterality Date   APPENDECTOMY     AUGMENTATION MAMMAPLASTY Bilateral 2013   PTERYGIUM EXCISION     TONSILLECTOMY     Transumblical Augmentation mammaplasty       Home Medications   Prior to Admission medications   Medication Sig Start Date End Date Taking? Authorizing Provider  Aspirin-Acetaminophen-Caffeine (EXCEDRIN PO) Take by mouth as needed.    [provider]  atorvastatin (LIPITOR) 20 MG tablet Take by mouth. 04/13/19   [provider]  Calcium Carbonate-Vit D-Min (CALCIUM 1200 PO) Take by mouth daily.    [provider]  hydrOXYzine (ATARAX) 25 MG tablet Take 1 tablet (25 mg total) by mouth 3 (three) times daily as needed for anxiety. 02/27/23   Leevy-Johnson, Lina Sar, NP   losartan (COZAAR) 25 MG tablet Take by mouth. 08/21/18   [provider]  Multiple Vitamin (MULTIVITAMIN) tablet Take 1 tablet by mouth daily.    [provider]  naproxen (NAPROSYN) 500 MG tablet Take 500 mg by mouth 2 (two) times daily with a meal.    [provider]  OLANZapine zydis (ZYPREXA) 5 MG disintegrating tablet Take 1 tablet (5 mg total) by mouth daily for 10 days. 02/28/23 03/10/23  Leevy-Johnson, Lina Sar, NP  olmesartan (BENICAR) 40 MG tablet Take 40 mg by mouth daily. 10/04/22   [provider]  ondansetron (ZOFRAN ODT) 4 MG disintegrating tablet Take 1 tablet (4 mg total) by mouth every 8 (eight) hours as needed for nausea or vomiting. 05/04/20   Domenick Gong, MD  traZODone (DESYREL) 50 MG tablet Take 1 tablet (50 mg total) by mouth at bedtime as needed for sleep. 02/27/23   Leevy-Johnson, Lina Sar, NP  ferrous sulfate 325 (65 FE) MG tablet Take 325 mg by mouth daily with breakfast.  05/04/20  [provider]     Allergies  Patient has no known allergies.   Family History   Family History  Problem Relation Age of Onset   Breast cancer Sister 89   Hypertension Mother      Physical Exam  Triage Vital Signs: ED Triage Vitals  Enc Vitals Group     BP 03/01/23 2346  131/76     Pulse Rate 03/01/23 2346 99     Resp 03/01/23 2346 (!) 22     Temp 03/01/23 2346 98 F (36.7 C)     Temp Source 03/01/23 2346 Oral     SpO2 03/01/23 2346 98 %     Weight 03/01/23 2347 132 lb (59.9 kg)     Height 03/01/23 2347 5\' 2"  (1.575 m)     Head Circumference --      Peak Flow --      Pain Score 03/01/23 2346 4     Pain Loc --      Pain Edu? --      Excl. in GC? --     Updated Vital Signs: BP 131/76   Pulse 99   Temp 98 F (36.7 C) (Oral)   Resp (!) 22   Ht 5\' 2"  (1.575 m)   Wt 59.9 kg   SpO2 98%   BMI 24.14 kg/m    General: Awake, no distress.  CV:  RRR.  Good peripheral perfusion.  Resp:  Normal effort.   CTAB. Abd:  Nontender.  No distention.  Other:  Cooperative.  Flat affect.  Left trapezius muscle spasms.  Supple neck without meningismus.  No carotid bruits.  5/5 motor strength and sensation BUE.   ED Results / Procedures / Treatments  Labs (all labs ordered are listed, but only abnormal results are displayed) Labs Reviewed  CBC - Abnormal; Notable for the following components:      Result Value   RBC 3.25 (*)    Hemoglobin 9.7 (*)    HCT 31.7 (*)    All other components within normal limits  ACETAMINOPHEN LEVEL - Abnormal; Notable for the following components:   Acetaminophen (Tylenol), Serum <10 (*)    All other components within normal limits  COMPREHENSIVE METABOLIC PANEL - Abnormal; Notable for the following components:   Glucose, Bld 102 (*)    BUN 28 (*)    Creatinine, Ser 1.46 (*)    GFR, Estimated 41 (*)    All other components within normal limits  SALICYLATE LEVEL - Abnormal; Notable for the following components:   Salicylate Lvl <7.0 (*)    All other components within normal limits  ETHANOL  URINE DRUG SCREEN, QUALITATIVE ([facility] ONLY)     EKG  None   RADIOLOGY None   Official radiology report(s): No results found.   PROCEDURES:  Critical Care performed: No  Procedures   MEDICATIONS ORDERED IN ED: Medications  lidocaine (LIDODERM) 5 % 1 patch (1 patch Transdermal Patch Applied 03/02/23 0132)  LORazepam (ATIVAN) tablet 1 mg (1 mg Oral Given 03/02/23 0132)  LORazepam (ATIVAN) tablet 1 mg (1 mg Oral Given 03/02/23 [id number])     IMPRESSION / MDM / ASSESSMENT AND PLAN / ED COURSE  I reviewed the triage vital signs and the nursing notes.                             62 year old female who returns to the ED with persistent hallucinations.  I have personally reviewed patient's records and note her ED visit from 02/26/2023 as well as televisit from 02/28/2023.  Patient's presentation is most consistent with exacerbation of chronic illness.  Patient contracts  for safety while in the emergency department. The patient has been placed in psychiatric observation due to the need to provide a safe environment for the patient while obtaining psychiatric consultation and evaluation, as  well as ongoing medical and medication management to treat the patient's condition.  The patient has not been placed under full IVC at this time.  Clinical Course as of 03/02/23 0541  Sat Mar 02, 2023  0256 Patient seen by overnight psychiatry team who recommends inpatient hospitalization. [JS]    Clinical Course User Index [JS] Irean HongSung, Aariana Shankland J, MD     FINAL CLINICAL IMPRESSION(S) / ED DIAGNOSES   Final diagnoses:  Moderate episode of recurrent major depressive disorder  Psychosis, unspecified psychosis type     Rx / DC Orders   ED Discharge Orders     None        Note:  This document was prepared using Dragon voice recognition software and may include unintentional dictation errors.   Irean HongSung, Laquentin Loudermilk J, MD 03/02/23 [phone]

## 2023-03-02 NOTE — ED Notes (Signed)
Pt provided a hospital snack tray and a fresh ice water in a foam cup. Pt provided a snack due to pt not being here earlier. Snack fully consumed and tray disposed of properly by this EDT. Pt expressed appreciation by stating "Thank you".
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
## 2023-03-02 NOTE — ED Notes (Signed)
Pt given warm blanket.

## 2023-03-03 MED ORDER — ACETAMINOPHEN 325 MG PO TABS
650.0000 mg | ORAL_TABLET | Freq: Once | ORAL | Status: AC
  Administered 2023-03-03: 650 mg via ORAL
  Filled 2023-03-03: qty 2

## 2023-03-03 NOTE — ED Notes (Signed)
VOL/pt accepted to [facility] 03/04/23 after 8AM

## 2023-03-03 NOTE — BH Assessment (Signed)
Writer spoke with the patient to complete an updated/reassessment. Patient was in the hallway, she spoke softly and kept her head down. Patient have bed for tomorrow (03/04/2023) with [facility].

## 2023-03-03 NOTE — ED Notes (Signed)
Pt given lunch tray and tea.

## 2023-03-03 NOTE — BH Assessment (Signed)
TTS contacted the patient's husband Albertelli [phone]), updated him about the patient going to [facility] and answer additional questions about her going there and not having any beds that are available. TTS provided the husband with [facility]'s contact information.

## 2023-03-03 NOTE — ED Notes (Signed)
Dinner tray provided

## 2023-03-03 NOTE — ED Provider Notes (Signed)
Emergency Medicine Observation Re-evaluation Note  Robin Schultz is a 62 y.o. female, seen on rounds today.  Pt initially presented to the ED for complaints of Psychiatric Evaluation Currently, the patient is sleeping.  Physical Exam  BP 123/67 (BP Location: Left Arm)   Pulse 81   Temp 98.2 F (36.8 C) (Oral)   Resp 18   Ht 5\' 2"  (1.575 m)   Wt 59.9 kg   SpO2 97%   BMI 24.14 kg/m  Physical Exam General: sleeping in hallway stretcher Lungs: no increased wob Psych: no agitation at this time  ED Course / MDM  EKG:   I have reviewed the labs performed to date as well as medications administered while in observation.  Recent changes in the last 24 hours include none.  Plan  Current plan is for inpatient psych admission. Pt is accepted at [facility].     Georga Hacking, MD 03/03/23 [phone]

## 2023-03-03 NOTE — ED Notes (Signed)
Hospital snack tray, mango italian ice tube, and ginger ale on ice in a foam cup provided to pt for snack.

## 2023-03-03 NOTE — BH Assessment (Signed)
Writer spoke with the patient to complete an updated/reassessment. Patient denies SI/HI. She and her husband both reports, the changes in her mood, behaviors and mental state changed within the last two weeks. Patient is having moments of confusion, disorganized and responding to internal stimuli.

## 2023-03-03 NOTE — ED Notes (Signed)
Patient took a shower this morning. Patient also, received her breakfast as well. Patient is comfortable with no signs of behavorial issues.

## 2023-03-04 NOTE — ED Notes (Signed)
No personal belongings left at hospital; per pt all belongings previously sent home with husband; did check locked storage just in case; no belongings noted.

## 2023-03-04 NOTE — ED Provider Notes (Signed)
Emergency Medicine Observation Re-evaluation Note  Robin Schultz is a 62 y.o. female, seen on rounds today.  Pt initially presented to the ED for complaints of Psychiatric Evaluation Currently, the patient is resting, voices no medical complaints.  Physical Exam  BP 116/83 (BP Location: Right Arm)   Pulse (!) 109   Temp 97.9 F (36.6 C) (Oral)   Resp 17   Ht 5\' 2"  (1.575 m)   Wt 59.9 kg   SpO2 97%   BMI 24.14 kg/m  Physical Exam General: Resting in no acute distress Cardiac: No cyanosis Lungs: Equal rise and fall Psych: Not agitated  ED Course / MDM  EKG:   I have reviewed the labs performed to date as well as medications administered while in observation.  Recent changes in the last 24 hours include no events overnight.  Plan  Current plan is for psychiatric disposition.    Irean Hong, MD 03/04/23 [phone]

## 2023-03-04 NOTE — ED Notes (Signed)
Pt requesting to shower however safe transport just arrived; pt notified.

## 2023-03-04 NOTE — ED Notes (Signed)
Pt's husband here to visit; notified safe transport here; pt's husband dropped off belongings he brought directly to safe transport staff.

## 2023-03-04 NOTE — ED Notes (Signed)
Attempted report to [facility] x1.  

## 2023-03-04 NOTE — ED Notes (Signed)
Hospital meal provided, pt tolerated w/o complaints.  Waste discarded appropriately.  

## 2023-03-04 NOTE — ED Notes (Signed)
Pt received breakfast tray, milk and water. 

## 2023-03-04 NOTE — ED Notes (Signed)
Called [facility] for transport to [facility] 

## 2023-03-04 NOTE — ED Notes (Signed)
VOL/Accepted to [facility] this AM

## 2023-03-04 NOTE — ED Notes (Signed)
Attempted report to [facility] x2.

## 2023-03-26 ENCOUNTER — Other Ambulatory Visit: Payer: Self-pay

## 2023-03-26 DIAGNOSIS — Z1231 Encounter for screening mammogram for malignant neoplasm of breast: Secondary | ICD-10-CM

## 2023-04-29 ENCOUNTER — Ambulatory Visit
Admission: RE | Admit: 2023-04-29 | Discharge: 2023-04-29 | Disposition: A | Discharge status: Home / Self Care | Source: Ambulatory Visit | Attending: Family Medicine | Admitting: Family Medicine

## 2023-04-29 DIAGNOSIS — Z1231 Encounter for screening mammogram for malignant neoplasm of breast: Secondary | ICD-10-CM | POA: Insufficient documentation

## 2023-04-30 ENCOUNTER — Other Ambulatory Visit: Payer: Self-pay | Admitting: Family Medicine

## 2023-04-30 DIAGNOSIS — N281 Cyst of kidney, acquired: Secondary | ICD-10-CM

## 2023-04-30 DIAGNOSIS — N183 Chronic kidney disease, stage 3 unspecified: Secondary | ICD-10-CM

## 2023-05-01 ENCOUNTER — Other Ambulatory Visit: Payer: Self-pay | Admitting: Family Medicine

## 2023-05-01 DIAGNOSIS — R928 Other abnormal and inconclusive findings on diagnostic imaging of breast: Secondary | ICD-10-CM

## 2023-05-01 DIAGNOSIS — N6489 Other specified disorders of breast: Secondary | ICD-10-CM

## 2023-05-07 ENCOUNTER — Ambulatory Visit
Admission: RE | Admit: 2023-05-07 | Discharge: 2023-05-07 | Disposition: A | Discharge status: Home / Self Care | Source: Ambulatory Visit | Attending: Family Medicine | Admitting: Family Medicine

## 2023-05-07 DIAGNOSIS — N6489 Other specified disorders of breast: Secondary | ICD-10-CM

## 2023-05-07 DIAGNOSIS — R928 Other abnormal and inconclusive findings on diagnostic imaging of breast: Secondary | ICD-10-CM | POA: Diagnosis present

## 2024-03-16 ENCOUNTER — Other Ambulatory Visit: Payer: Self-pay | Admitting: Family Medicine

## 2024-03-16 DIAGNOSIS — Z1231 Encounter for screening mammogram for malignant neoplasm of breast: Secondary | ICD-10-CM

## 2024-04-29 ENCOUNTER — Ambulatory Visit

## 2024-05-05 ENCOUNTER — Ambulatory Visit
Admission: RE | Admit: 2024-05-05 | Discharge: 2024-05-05 | Disposition: A | Discharge status: Home / Self Care | Source: Ambulatory Visit | Attending: Family Medicine | Admitting: Family Medicine

## 2024-05-05 DIAGNOSIS — Z1231 Encounter for screening mammogram for malignant neoplasm of breast: Secondary | ICD-10-CM | POA: Diagnosis present
